# Patient Record
Sex: Male | Born: 1950 | Race: Black or African American | Hispanic: No | Marital: Married | Smoking: Former smoker
Health system: Southern US, Community
[De-identification: ages and names within clinical notes are randomized; demographics above are authoritative.]

## PROBLEM LIST (undated history)

## (undated) DIAGNOSIS — G4730 Sleep apnea, unspecified: Secondary | ICD-10-CM

## (undated) DIAGNOSIS — Z8619 Personal history of other infectious and parasitic diseases: Secondary | ICD-10-CM

## (undated) DIAGNOSIS — M1990 Unspecified osteoarthritis, unspecified site: Secondary | ICD-10-CM

## (undated) DIAGNOSIS — J45909 Unspecified asthma, uncomplicated: Secondary | ICD-10-CM

## (undated) DIAGNOSIS — I10 Essential (primary) hypertension: Secondary | ICD-10-CM

## (undated) HISTORY — DX: Essential (primary) hypertension: I10

## (undated) HISTORY — DX: Unspecified asthma, uncomplicated: J45.909

## (undated) HISTORY — PX: COLONOSCOPY: SHX174

## (undated) HISTORY — DX: Personal history of other infectious and parasitic diseases: Z86.19

## (undated) HISTORY — PX: CORRECTION HAMMER TOE: SUR317

---

## 2012-03-21 ENCOUNTER — Other Ambulatory Visit: Payer: Self-pay | Admitting: Orthopedic Surgery

## 2012-03-21 DIAGNOSIS — M25512 Pain in left shoulder: Secondary | ICD-10-CM

## 2012-03-29 ENCOUNTER — Ambulatory Visit
Admission: RE | Admit: 2012-03-29 | Discharge: 2012-03-29 | Disposition: A | Discharge status: Home / Self Care | Payer: BC Managed Care – PPO | Source: Ambulatory Visit | Attending: Orthopedic Surgery | Admitting: Orthopedic Surgery

## 2012-03-29 DIAGNOSIS — M25512 Pain in left shoulder: Secondary | ICD-10-CM

## 2014-01-30 ENCOUNTER — Encounter: Payer: Self-pay | Admitting: Family Medicine

## 2014-01-30 ENCOUNTER — Ambulatory Visit (INDEPENDENT_AMBULATORY_CARE_PROVIDER_SITE_OTHER): Payer: BLUE CROSS/BLUE SHIELD | Admitting: Family Medicine

## 2014-01-30 VITALS — BP 148/96 | HR 60 | Temp 98.4°F | Resp 16 | Ht 76.0 in | Wt 218.8 lb

## 2014-01-30 DIAGNOSIS — I10 Essential (primary) hypertension: Secondary | ICD-10-CM

## 2014-01-30 DIAGNOSIS — R768 Other specified abnormal immunological findings in serum: Secondary | ICD-10-CM | POA: Insufficient documentation

## 2014-01-30 DIAGNOSIS — R894 Abnormal immunological findings in specimens from other organs, systems and tissues: Secondary | ICD-10-CM

## 2014-01-30 DIAGNOSIS — Z13220 Encounter for screening for lipoid disorders: Secondary | ICD-10-CM

## 2014-01-30 DIAGNOSIS — G4730 Sleep apnea, unspecified: Secondary | ICD-10-CM | POA: Insufficient documentation

## 2014-01-30 LAB — COMPLETE METABOLIC PANEL WITH GFR
ALT: 133 U/L — ABNORMAL HIGH (ref 0–53)
AST: 93 U/L — ABNORMAL HIGH (ref 0–37)
Albumin: 4.2 g/dL (ref 3.5–5.2)
Alkaline Phosphatase: 59 U/L (ref 39–117)
BUN: 10 mg/dL (ref 6–23)
CO2: 26 mEq/L (ref 19–32)
Calcium: 9.7 mg/dL (ref 8.4–10.5)
Chloride: 103 mEq/L (ref 96–112)
Creat: 0.8 mg/dL (ref 0.50–1.35)
GFR, Est African American: >89 mL/min
GFR, Est Non African American: >89 mL/min
Glucose, Bld: 85 mg/dL (ref 70–99)
Potassium: 3.9 mEq/L (ref 3.5–5.3)
Sodium: 136 mEq/L (ref 135–145)
Total Bilirubin: 0.7 mg/dL (ref 0.2–1.2)
Total Protein: 7.6 g/dL (ref 6.0–8.3)

## 2014-01-30 LAB — LIPID PANEL
Cholesterol: 166 mg/dL (ref 0–200)
HDL: 71 mg/dL (ref >39)
LDL Cholesterol: 83 mg/dL (ref 0–99)
Total CHOL/HDL Ratio: 2.3 Ratio
Triglycerides: 59 mg/dL (ref <150)
VLDL: 12 mg/dL (ref 0–40)

## 2014-01-30 LAB — TSH: TSH: 1.373 u[IU]/mL (ref 0.350–4.500)

## 2014-01-30 LAB — T4, FREE: Free T4: 1.26 ng/dL (ref 0.80–1.80)

## 2014-01-30 MED ORDER — BLOOD PRESSURE MONITOR/WRIST DEVI: 1.0000 | Freq: Every day | Status: DC

## 2014-01-30 MED ORDER — AMLODIPINE-VALSARTAN-HCTZ 5-160-12.5 MG PO TABS: 1.0000 | ORAL_TABLET | Freq: Every day | ORAL | Status: DC

## 2014-01-30 NOTE — Patient Instructions (Addendum)
DASH Eating Plan DASH stands for "Dietary Approaches to Stop Hypertension." The DASH eating plan is a healthy eating plan that has been shown to reduce high blood pressure (hypertension). Additional health benefits may include reducing the risk of type 2 diabetes mellitus, heart disease, and stroke. The DASH eating plan may also help with weight loss. WHAT DO I NEED TO KNOW ABOUT THE DASH EATING PLAN? For the DASH eating plan, you will follow these general guidelines:  Choose foods with a percent daily value for sodium of less than 5% (as listed on the food label).  Use salt-free seasonings or herbs instead of table salt or sea salt.  Check with your health care provider or pharmacist before using salt substitutes.  Eat lower-sodium products, often labeled as "lower sodium" or "no salt added."  Eat fresh foods.  Eat more vegetables, fruits, and low-fat dairy products.  Choose whole grains. Look for the word "whole" as the first word in the ingredient list.  Choose fish and skinless chicken or turkey more often than red meat. Limit fish, poultry, and meat to 6 oz (170 g) each day.  Limit sweets, desserts, sugars, and sugary drinks.  Choose heart-healthy fats.  Limit cheese to 1 oz (28 g) per day.  Eat more home-cooked food and less restaurant, buffet, and fast food.  Limit fried foods.  Cook foods using methods other than frying.  Limit canned vegetables. If you do use them, rinse them well to decrease the sodium.  When eating at a restaurant, ask that your food be prepared with less salt, or no salt if possible. WHAT FOODS CAN I EAT? Seek help from a dietitian for individual calorie needs. Grains Whole grain or whole wheat bread. Brown rice. Whole grain or whole wheat pasta. Quinoa, bulgur, and whole grain cereals. Low-sodium cereals. Corn or whole wheat flour tortillas. Whole grain cornbread. Whole grain crackers. Low-sodium crackers. Vegetables Fresh or frozen vegetables  (raw, steamed, roasted, or grilled). Low-sodium or reduced-sodium tomato and vegetable juices. Low-sodium or reduced-sodium tomato sauce and paste. Low-sodium or reduced-sodium canned vegetables.  Fruits All fresh, canned (in natural juice), or frozen fruits. Meat and Other Protein Products Ground beef (85% or leaner), grass-fed beef, or beef trimmed of fat. Skinless chicken or turkey. Ground chicken or turkey. Pork trimmed of fat. All fish and seafood. Eggs. Dried beans, peas, or lentils. Unsalted nuts and seeds. Unsalted canned beans. Dairy Low-fat dairy products, such as skim or 1% milk, 2% or reduced-fat cheeses, low-fat ricotta or cottage cheese, or plain low-fat yogurt. Low-sodium or reduced-sodium cheeses. Fats and Oils Tub margarines without trans fats. Light or reduced-fat mayonnaise and salad dressings (reduced sodium). Avocado. Safflower, olive, or canola oils. Natural peanut or almond butter. Other Unsalted popcorn and pretzels. The items listed above may not be a complete list of recommended foods or beverages. Contact your dietitian for more options. WHAT FOODS ARE NOT RECOMMENDED? Grains White bread. White pasta. White rice. Refined cornbread. Bagels and croissants. Crackers that contain trans fat. Vegetables Creamed or fried vegetables. Vegetables in a cheese sauce. Regular canned vegetables. Regular canned tomato sauce and paste. Regular tomato and vegetable juices. Fruits Dried fruits. Canned fruit in light or heavy syrup. Fruit juice. Meat and Other Protein Products Fatty cuts of meat. Ribs, chicken wings, bacon, sausage, bologna, salami, chitterlings, fatback, hot dogs, bratwurst, and packaged luncheon meats. Salted nuts and seeds. Canned beans with salt. Dairy Whole or 2% milk, cream, half-and-half, and cream cheese. Whole-fat or sweetened yogurt. Full-fat   cheeses or blue cheese. Nondairy creamers and whipped toppings. Processed cheese, cheese spreads, or cheese  curds. Condiments Onion and garlic salt, seasoned salt, table salt, and sea salt. Canned and packaged gravies. Worcestershire sauce. Tartar sauce. Barbecue sauce. Teriyaki sauce. Soy sauce, including reduced sodium. Steak sauce. Fish sauce. Oyster sauce. Cocktail sauce. Horseradish. Ketchup and mustard. Meat flavorings and tenderizers. Bouillon cubes. Hot sauce. Tabasco sauce. Marinades. Taco seasonings. Relishes. Fats and Oils Butter, stick margarine, lard, shortening, ghee, and bacon fat. Coconut, palm kernel, or palm oils. Regular salad dressings. Other Pickles and olives. Salted popcorn and pretzels. The items listed above may not be a complete list of foods and beverages to avoid. Contact your dietitian for more information. WHERE CAN I FIND MORE INFORMATION? National Heart, Lung, and Blood Institute: CableProm[employer].itwww.nhlbi.nih.gov/health/health-topics/topics/dash/ Document Released: 12/30/2010 Document Revised: 05/27/2013 Document Reviewed: 11/14/2012 [facility] Patient Information 2015 [employer], [place]. This information is not intended to replace advice given to you by your health care provider. Make sure you discuss any questions you have with your health care provider.   You will continue taking current blood pressure medication- 1/2 tablet daily. Check BP daily; try to check at the same time each day and be sure you are seated and relaxed for 3-5 minutes. Record your readings and bring them to next visit. Goal BP: < or = 150/90

## 2014-01-30 NOTE — Progress Notes (Signed)
   Subjective:    Patient ID: Jason Mullins, male    DOB: [birth year], 64 y.o.   MRN: [id number]  HPI This 64 y.o. Male [facility]; he is a retired Advertising account plannerinsurance agent. [place]. Previous primary care with Renaye RakersVeita Bland, MD for treatment of HTN. Pt is compliant w/ medication but takes only 1/2 tablet daily. He is not doing home BP readings. Practices healthy lifestyle; plans to rejoin [employer]. He enjoys golf.  Pt diagnoses with Hepatitis C in 1980s w/ undetectable viral load. He has never required treatment but has liver function monitored periodically.  IMM- Pt declines Flu vaccine; states other vaccine should be in records from previous PCP.  Pt has no significant medical history or surgical history (s/p hammer toe surgery).  No Known Allergies   Review of Systems  Constitutional: Negative.   HENT: Negative.   Eyes: Negative.   Respiratory: Negative.   Cardiovascular: Negative.   Gastrointestinal: Negative.   Endocrine: Negative.   Genitourinary: Negative.   Musculoskeletal: Negative.   Skin: Negative.   Allergic/Immunologic: Negative.   Neurological: Negative.   Hematological: Negative.   Psychiatric/Behavioral: Negative.        Objective:   Physical Exam  Constitutional: He is oriented to person, place, and time. He appears well-developed and well-nourished. No distress.  HENT:  Head: Normocephalic and atraumatic.  Right Ear: External ear normal.  Left Ear: External ear normal.  Nose: Nose normal.  Mouth/Throat: Oropharynx is clear and moist.  Eyes: Conjunctivae and EOM are normal. Pupils are equal, round, and reactive to light. No scleral icterus.  Neck: Normal range of motion. Neck supple. No thyromegaly present.  Cardiovascular: Normal rate, regular rhythm and normal heart sounds.  Exam reveals no gallop.   No murmur heard. Pulmonary/Chest:  Effort normal and breath sounds normal. No respiratory distress. He has no wheezes.  Abdominal: Bowel sounds are normal. He exhibits no distension and no mass. There is no tenderness. There is no guarding.  Musculoskeletal: Normal range of motion. He exhibits no edema or tenderness.  Lymphadenopathy:    He has no cervical adenopathy.  Neurological: He is alert and oriented to person, place, and time. He has normal reflexes. No cranial nerve deficit. He exhibits normal muscle tone. Coordination normal.  Skin: Skin is warm and dry. No rash noted. He is not diaphoretic. No erythema.  Psychiatric: He has a normal mood and affect. His behavior is normal. Judgment and thought content normal.  Nursing note and vitals reviewed.      Assessment & Plan:  Benign essential HTN - Cont.  Amlodipine- Valsartan-HCTZ 160-12.5 mg  1/2 tablet daily. Monitor BP daily.  Plan: COMPLETE METABOLIC PANEL WITH GFR, T4, free, TSH  Hepatitis C antibody test positive- Monitor LFTs.  Screening for hyperlipidemia - Plan: Lipid panel

## 2014-02-04 NOTE — Progress Notes (Signed)
Quick Note:  Please advise pt regarding following labs... All labs are normal except liver function tests. AST and ALT are above normal; this is related to Hepatitis C infection. I do not have any other values to compare with so it would be a good idea to recheck these values in 3-4 months to check for stability or change. Labs will be repeated at your visit in April 2016.  Copy to pt. ______

## 2014-05-01 ENCOUNTER — Ambulatory Visit (INDEPENDENT_AMBULATORY_CARE_PROVIDER_SITE_OTHER): Payer: BLUE CROSS/BLUE SHIELD | Admitting: Family Medicine

## 2014-05-01 ENCOUNTER — Encounter: Payer: Self-pay | Admitting: Family Medicine

## 2014-05-01 VITALS — BP 128/73 | HR 60 | Temp 97.7°F | Resp 16 | Ht 75.5 in | Wt 216.2 lb

## 2014-05-01 DIAGNOSIS — R7989 Other specified abnormal findings of blood chemistry: Secondary | ICD-10-CM | POA: Diagnosis not present

## 2014-05-01 DIAGNOSIS — R768 Other specified abnormal immunological findings in serum: Secondary | ICD-10-CM

## 2014-05-01 DIAGNOSIS — R894 Abnormal immunological findings in specimens from other organs, systems and tissues: Secondary | ICD-10-CM | POA: Diagnosis not present

## 2014-05-01 DIAGNOSIS — I10 Essential (primary) hypertension: Secondary | ICD-10-CM

## 2014-05-01 DIAGNOSIS — R945 Abnormal results of liver function studies: Secondary | ICD-10-CM

## 2014-05-01 LAB — HEPATIC FUNCTION PANEL
ALT: 125 U/L — ABNORMAL HIGH (ref 0–53)
AST: 95 U/L — ABNORMAL HIGH (ref 0–37)
Albumin: 4.1 g/dL (ref 3.5–5.2)
Alkaline Phosphatase: 53 U/L (ref 39–117)
Bilirubin, Direct: 0.3 mg/dL (ref 0.0–0.3)
Indirect Bilirubin: 0.7 mg/dL (ref 0.2–1.2)
Total Bilirubin: 1 mg/dL (ref 0.2–1.2)
Total Protein: 7.6 g/dL (ref 6.0–8.3)

## 2014-05-01 NOTE — Patient Instructions (Signed)
DENTIST- Dr. Katherine RoanP. Sharon Walker  Phone number: [phone].   Hepatitis C Hepatitis C is a viral infection of the liver. Infection may go undetected for months or years because symptoms may be absent or very mild. Chronic liver disease is the main danger of hepatitis C. This may lead to scarring of the liver (cirrhosis), liver failure, and liver cancer. CAUSES  Hepatitis C is caused by the hepatitis C virus (HCV). Formerly, hepatitis C infections were most commonly transmitted through blood transfusions. In the early 1990s, routine testing of donated blood for hepatitis C and exclusion of blood that tests positive for HCV began. Now, HCV is most commonly transmitted from person to person through injection drug use, sharing needles, or sex with an infected person. A caregiver may also get the infection from exposure to the blood of an infected patient by way of a cut or needle stick.  SYMPTOMS  Acute Phase Many cases of acute HCV infection are mild and cause few problems.Some people may not even realize they are sick.Symptoms in others may last a few weeks to several months and include:  Feeling very tired.  Loss of appetite.  Nausea.  Vomiting.  Abdominal pain.  Dark yellow urine.  Yellow skin and eyes (jaundice).  Itching of the skin. Chronic Phase  Between 50% to 85% of people who get HCV infection become "chronic carriers." They often have no symptoms, but the virus stays in their body.They may spread the virus to others and can get long-term liver disease.  Many people with chronic HCV infection remain healthy for many years. However, up to 1 in 5 chronically infected people may develop severe liver diseases including scarring of the liver (cirrhosis), liver failure, or liver cancer. DIAGNOSIS  Diagnosis of hepatitis C infection is made by testing blood for the presence of hepatitis C viral particles called RNA. Other tests may also be done to measure the status of current liver  function, exclude other liver problems, or assess liver damage. TREATMENT  Treatment with many antiviral drugs is available and recommended for some patients with chronic HCV infection. Drug treatment is generally considered appropriate for patients who:  Are 65 years of age or older.  Have a positive test for HCV particles in the blood.  Have a liver tissue sample (biopsy) that shows chronic hepatitis and significant scarring (fibrosis).  Do not have signs of liver failure.  Have acceptable blood test results that confirm the wellness of other body organs.  Are willing to be treated and conform to treatment requirements.  Have no other circumstances that would prevent treatment from being recommended (contraindications). All people who are offered and choose to receive drug treatment must understand that careful medical follow up for many months and even years is crucial in order to make successful care possible. The goal of drug treatment is to eliminate any evidence of HCV in the blood on a long-term basis. This is called a "sustained virologic response" or SVR. Achieving a SVR is associated with a decrease in the chance of life-threatening liver problems, need for a liver transplant, liver cancer rates, and liver-related complications. Successful treatment currently requires taking treatment drugs for at least 24 weeks and up to 72 weeks. An injected drug (interferon) given weekly and an oral antiviral medicine taken daily are usually prescribed. Side effects from these drugs are common and some may be very serious. Your response to treatment must be carefully monitored by both you and your caregiver throughout the entire treatment  period. PREVENTION There is no vaccine for hepatitis C. The only way to prevent the disease is to reduce the risk of exposure to the virus.   Avoid sharing drug needles or personal items like toothbrushes, razors, and nail clippers with an infected  person.  Healthcare workers need to avoid injuries and wear appropriate protective equipment such as gloves, gowns, and face masks when performing invasive medical or nursing procedures. HOME CARE INSTRUCTIONS  To avoid making your liver disease worse:  Strictly avoid drinking alcohol.  Carefully review all new prescriptions of medicines with your caregiver. Ask your caregiver which drugs you should avoid. The following drugs are toxic to the liver, and your caregiver may tell you to avoid them:  Isoniazid.  Methyldopa.  Acetaminophen.  Anabolic steroids (muscle-building drugs).  Erythromycin.  Oral contraceptives (birth control pills).  Check with your caregiver to make sure medicine you are currently taking will not be harmful.  Periodic blood tests may be required. Follow your caregiver's advice about when you should have blood tests.  Avoid a sexual relationship until advised otherwise by your caregiver.  Avoid activities that could expose other people to your blood. Examples include sharing a toothbrush, nail clippers, razors, and needles.  Bed rest is not necessary, but it may make you feel better. Recovery time is not related to the amount of rest you receive.  This infection is contagious. Follow your caregiver's instructions in order to avoid spread of the infection. SEEK IMMEDIATE MEDICAL CARE IF:  You have increasing fatigue or weakness.  You have an oral temperature above 102 F (38.9 C), not controlled by medicine.  You develop loss of appetite, nausea, or vomiting.  You develop jaundice.  You develop easy bruising or bleeding.  You develop any severe problems as a result of your treatment. MAKE SURE YOU:   Understand these instructions.  Will watch your condition.  Will get help right away if you are not doing well or get worse. Document Released: 01/08/2000 Document Revised: 04/04/2011 Document Reviewed: 04/24/2013 [facility] Patient Information  2015 [place], [place]. This information is not intended to replace advice given to you by your health care provider. Make sure you discuss any questions you have with your health care provider.

## 2014-05-02 LAB — HEPATITIS C ANTIBODY: HCV Ab: REACTIVE — AB

## 2014-05-05 LAB — HEPATITIS C RNA QUANTITATIVE
HCV Quantitative Log: 7.08 {Log} — ABNORMAL HIGH (ref <1.18)
HCV Quantitative: 11945904 IU/mL — ABNORMAL HIGH (ref <15)

## 2014-05-05 NOTE — Progress Notes (Signed)
Subjective:    Patient ID: Jason Mullins, male    DOB: Oct 25, 1950, 89 y.o.   MRN: [id number]  HPI  This 64 y.o. Male returns to discuss LFT elevation on last CMET, drawn in Jan 2016. Pt known to be Hepatitis C antibody positive. This was diagnosed 30 years ago w/ no detectable virus at that time. His LFTs have been chronically above normal. Pt has been asymptomatic until recently when he began to experience mild itching. No other significant symptoms.  Pt has well controlled HTN; compliance with medication is good. At last visit, he reported mild lightheadedness when dehydrated after a round of golf. Hydration is better, w/o recurrence of dizziness or lightheadedness. He denies CP or palpitations, SOB, edema, weakness or syncope.  Patient Active Problem List   Diagnosis Date Noted  . Benign essential HTN 01/30/2014  . Hepatitis C antibody test positive 01/30/2014  . History of Sleep apnea 01/30/2014    Prior to Admission medications   Medication Sig Start Date End Date Taking? Authorizing Provider  Amlodipine-Valsartan-HCTZ 5-160-12.5 MG TABS Take 1 tablet by mouth daily. 01/30/14  Yes Maurice MarchBarbara B Marlow Hendrie, MD  Blood Pressure Monitoring (BLOOD PRESSURE MONITOR/WRIST) DEVI 1 Device by Does not apply route daily. 01/30/14  Yes Maurice MarchBarbara B Aven Christen, MD    Past Surgical History  Procedure Laterality Date  . Correction hammer toe      History   Social History  . Marital Status: Married    Spouse Name: [name]  . Number of Children: N/A  . Years of Education: N/A   Occupational History  . Retired.   Social History Main Topics  . Smoking status: Former Games developermoker  . Smokeless tobacco: Not on file  . Alcohol Use: 0.0 oz/week    0 Standard drinks or equivalent per week     Comment: 3-4 times a week.  . Drug Use: No  . Sexual Activity: Yes   Other Topics Concern  . Not on file   Social History Narrative    Family History  Problem Relation Age of Onset  . Cancer Mother   . Cancer  Father     Review of Systems  Constitutional: Negative.   HENT: Negative.   Eyes: Negative.   Respiratory: Negative.   Cardiovascular: Negative.   Gastrointestinal: Negative.   Endocrine: Negative.   Musculoskeletal: Negative.   Allergic/Immunologic: Positive for environmental allergies.  Neurological: Negative.   Psychiatric/Behavioral: Negative.       Objective:   Physical Exam  Constitutional: He is oriented to person, place, and time. He appears well-developed and well-nourished. No distress.  HENT:  Head: Normocephalic and atraumatic.  Right Ear: External ear normal.  Left Ear: External ear normal.  Mouth/Throat: Oropharynx is clear and moist.  Eyes: Conjunctivae and EOM are normal. No scleral icterus.  Neck: Normal range of motion. Neck supple.  Cardiovascular: Normal rate and regular rhythm.   Pulmonary/Chest: Effort normal. No respiratory distress.  Musculoskeletal: Normal range of motion. He exhibits no edema.  Neurological: He is alert and oriented to person, place, and time. No cranial nerve deficit. Coordination normal.  Skin: Skin is warm and dry. No rash noted. He is not diaphoretic. No erythema.  Psychiatric: He has a normal mood and affect. His behavior is normal. Judgment and thought content normal.  Nursing note and vitals reviewed.      Assessment & Plan:  Hepatitis C antibody test positive - Known to be Ab+ but need to know if there is detectable virus, previously  not present. Plan: Hepatitis C antibody, reflex  Elevated LFTs - Plan: Hepatic function panel  Benign essential HTN- Stable and well controlled on current medication.

## 2014-05-06 ENCOUNTER — Telehealth: Payer: Self-pay | Admitting: Family Medicine

## 2014-05-06 DIAGNOSIS — R768 Other specified abnormal immunological findings in serum: Secondary | ICD-10-CM

## 2014-05-06 NOTE — Telephone Encounter (Signed)
Spoke with pt about results of Hep c labs; he has very high viral load. I will refer him to Hep C specialist for further testing (genotype, liver biopsy, etc) He has never had treatment.

## 2014-06-25 ENCOUNTER — Other Ambulatory Visit (HOSPITAL_COMMUNITY): Payer: Self-pay | Admitting: Nurse Practitioner

## 2014-06-25 DIAGNOSIS — B182 Chronic viral hepatitis C: Secondary | ICD-10-CM

## 2014-07-15 ENCOUNTER — Telehealth (HOSPITAL_COMMUNITY): Payer: Self-pay

## 2014-07-15 NOTE — Telephone Encounter (Signed)
Called to remind pt of 11am appt at [facility]. Pt agreed to stay npo 6 hrs prior in prep for exam. AW

## 2014-07-16 ENCOUNTER — Ambulatory Visit (HOSPITAL_COMMUNITY)
Admission: RE | Admit: 2014-07-16 | Discharge: 2014-07-16 | Disposition: A | Discharge status: Home / Self Care | Payer: BLUE CROSS/BLUE SHIELD | Source: Ambulatory Visit | Attending: Nurse Practitioner | Admitting: Nurse Practitioner

## 2014-07-16 DIAGNOSIS — B182 Chronic viral hepatitis C: Secondary | ICD-10-CM | POA: Diagnosis present

## 2014-07-16 DIAGNOSIS — N281 Cyst of kidney, acquired: Secondary | ICD-10-CM | POA: Insufficient documentation

## 2014-09-08 DIAGNOSIS — B182 Chronic viral hepatitis C: Secondary | ICD-10-CM | POA: Diagnosis not present

## 2014-10-06 ENCOUNTER — Telehealth: Payer: Self-pay

## 2014-10-06 NOTE — Telephone Encounter (Signed)
Pt called back, he had called earlier this morning, he wanted to make sure his refill request would be reviewed today, because he is completely out of his BP meds Please call pt to advise

## 2014-10-06 NOTE — Telephone Encounter (Signed)
Patient is calling to get refill on blood pressure rx He uses [facility] on [address] in [place]

## 2014-10-07 ENCOUNTER — Other Ambulatory Visit: Payer: Self-pay | Admitting: *Deleted

## 2014-10-07 DIAGNOSIS — I10 Essential (primary) hypertension: Secondary | ICD-10-CM

## 2014-10-07 MED ORDER — AMLODIPINE-VALSARTAN-HCTZ 5-160-12.5 MG PO TABS: 1.0000 | ORAL_TABLET | Freq: Every day | ORAL | Status: DC

## 2014-11-05 ENCOUNTER — Encounter: Payer: Self-pay | Admitting: Family Medicine

## 2014-11-05 ENCOUNTER — Ambulatory Visit (INDEPENDENT_AMBULATORY_CARE_PROVIDER_SITE_OTHER): Payer: Medicare Other | Admitting: Family Medicine

## 2014-11-05 VITALS — BP 142/90 | HR 63 | Temp 98.4°F | Resp 16 | Ht 75.5 in | Wt 213.0 lb

## 2014-11-05 DIAGNOSIS — Z23 Encounter for immunization: Secondary | ICD-10-CM

## 2014-11-05 DIAGNOSIS — I10 Essential (primary) hypertension: Secondary | ICD-10-CM | POA: Diagnosis not present

## 2014-11-05 DIAGNOSIS — B1710 Acute hepatitis C without hepatic coma: Secondary | ICD-10-CM | POA: Diagnosis not present

## 2014-11-05 DIAGNOSIS — B182 Chronic viral hepatitis C: Secondary | ICD-10-CM | POA: Diagnosis not present

## 2014-11-05 MED ORDER — AMLODIPINE BESYLATE 5 MG PO TABS: 5.0000 mg | ORAL_TABLET | Freq: Every day | ORAL | Status: DC

## 2014-11-05 MED ORDER — LOSARTAN POTASSIUM-HCTZ 100-12.5 MG PO TABS
1.0000 | ORAL_TABLET | Freq: Every day | ORAL | Status: DC
Start: 2014-11-05 — End: 2015-05-06

## 2014-11-05 NOTE — Patient Instructions (Addendum)
You received your first Hep B and Hep A vaccines today- please come back for the rest of the series as directed I have changed your BP medication to the regimen below- it will be 2 pills but should be covered by your insurance.  Please continue to keep an eye on your blood pressure and let me know if you are running too low or too high.  I would like you to be 120- 140/ 75- 90  Meds ordered this encounter  Medications  . amLODipine (NORVASC) 5 MG tablet    Sig: Take 1 tablet (5 mg total) by mouth daily.    Dispense:  90 tablet    Refill:  3  . losartan-hydrochlorothiazide (HYZAAR) 100-12.5 MG tablet    Sig: Take 1 tablet by mouth daily.    Dispense:  90 tablet    Refill:  3

## 2014-11-05 NOTE — Progress Notes (Signed)
[facility] [address], [place] [address] [phone]  Date:  11/05/2014   Name:  Jason CockingJames Mullins   DOB:  [birth year]   MRN:  [id number]  PCP:  No primary care provider on file.    Chief Complaint: Follow-up; Hypertension; and Immunizations   History of Present Illness:  Jason CockingJames Pal is a 64 y.o. very pleasant male patient who presents with the following:  I am taking over as PCP for this pt since my partner Dr. Audria NineMcPherson has retired He has been on a amlodipine/ valsartan/ hctz pill but this is no longer covered   He is just finishing up harvoni for Hep C- it looks like he has cleared the infection He is supposed to get Hep A and B shots- he has never had these and would like to do so  Patient Active Problem List   Diagnosis Date Noted  . Benign essential HTN 01/30/2014  . Hepatitis C antibody test positive 01/30/2014  . History of Sleep apnea 01/30/2014    Past Medical History  Diagnosis Date  . Asthma   . Hypertension     Past Surgical History  Procedure Laterality Date  . Correction hammer toe      Social History  Substance Use Topics  . Smoking status: Former Games developermoker  . Smokeless tobacco: None  . Alcohol Use: 0.0 oz/week    0 Standard drinks or equivalent per week     Comment: 3-4 times a week.    Family History  Problem Relation Age of Onset  . Cancer Mother   . Cancer Father     No Known Allergies  Medication list has been reviewed and updated.  Current Outpatient Prescriptions on File Prior to Visit  Medication Sig Dispense Refill  . Amlodipine-Valsartan-HCTZ 5-160-12.5 MG TABS Take 1 tablet by mouth daily. 90 tablet 1  . Blood Pressure Monitoring (BLOOD PRESSURE MONITOR/WRIST) DEVI 1 Device by Does not apply route daily. 1 Device 0   No current facility-administered medications on file prior to visit.    Review of Systems:  As per HPI- otherwise negative.   Physical Examination: Filed Vitals:   11/05/14 1612   BP: 150/100  Pulse: 63  Temp: 98.4 F (36.9 C)  Resp: 16   Filed Vitals:   11/05/14 1612  Height: 6' 3.5" (1.918 m)  Weight: 213 lb (96.616 kg)   Body mass index is 26.26 kg/(m^2). Ideal Body Weight: Weight in (lb) to have BMI = 25: 202.3  GEN: WDWN, NAD, Non-toxic, A & O x 3, looks well HEENT: Atraumatic, Normocephalic. Neck supple. No masses, No LAD. Ears and Nose: No external deformity. CV: RRR, No M/G/R. No JVD. No thrill. No extra heart sounds. PULM: CTA B, no wheezes, crackles, rhonchi. No retractions. No resp. distress. No accessory muscle use. ABD: S, NT, ND, +BS. No rebound. No HSM. EXTR: No c/c/e NEURO Normal gait.  PSYCH: Normally interactive. Conversant. Not depressed or anxious appearing.  Calm demeanor.    Assessment and Plan: Essential hypertension - Plan: amLODipine (NORVASC) 5 MG tablet, losartan-hydrochlorothiazide (HYZAAR) 100-12.5 MG tablet  Acute hepatitis C virus infection without hepatic coma - Plan: Hepatitis A vaccine adult IM, Hepatitis B vaccine adult IM  Need for hepatitis vaccination - Plan: Hepatitis A vaccine adult IM, Hepatitis B vaccine adult IM history of chronic hep C, hopefully will soon be cured with harvoni He was asked to get hep b and hep a vaccinations- we are glad to start this for  him today Changed his BP medication to a regimen that is covered as below  Meds ordered this encounter  Medications  . amLODipine (NORVASC) 5 MG tablet    Sig: Take 1 tablet (5 mg total) by mouth daily.    Dispense:  90 tablet    Refill:  3  . losartan-hydrochlorothiazide (HYZAAR) 100-12.5 MG tablet    Sig: Take 1 tablet by mouth daily.    Dispense:  90 tablet    Refill:  3      Signed Abbe Amsterdam, MD

## 2014-11-12 DIAGNOSIS — K7469 Other cirrhosis of liver: Secondary | ICD-10-CM | POA: Diagnosis not present

## 2014-11-12 DIAGNOSIS — B182 Chronic viral hepatitis C: Secondary | ICD-10-CM | POA: Diagnosis not present

## 2014-11-13 ENCOUNTER — Other Ambulatory Visit: Payer: Self-pay | Admitting: Nurse Practitioner

## 2014-11-13 DIAGNOSIS — K7469 Other cirrhosis of liver: Secondary | ICD-10-CM

## 2014-12-29 ENCOUNTER — Ambulatory Visit
Admission: RE | Admit: 2014-12-29 | Discharge: 2014-12-29 | Disposition: A | Discharge status: Home / Self Care | Payer: Medicare Other | Source: Ambulatory Visit | Attending: Nurse Practitioner | Admitting: Nurse Practitioner

## 2014-12-29 DIAGNOSIS — K7460 Unspecified cirrhosis of liver: Secondary | ICD-10-CM | POA: Diagnosis not present

## 2014-12-29 DIAGNOSIS — K7469 Other cirrhosis of liver: Secondary | ICD-10-CM

## 2015-02-09 DIAGNOSIS — B182 Chronic viral hepatitis C: Secondary | ICD-10-CM | POA: Diagnosis not present

## 2015-02-17 DIAGNOSIS — H40013 Open angle with borderline findings, low risk, bilateral: Secondary | ICD-10-CM | POA: Diagnosis not present

## 2015-02-17 DIAGNOSIS — H527 Unspecified disorder of refraction: Secondary | ICD-10-CM | POA: Diagnosis not present

## 2015-04-29 ENCOUNTER — Other Ambulatory Visit: Payer: Self-pay | Admitting: Nurse Practitioner

## 2015-04-29 DIAGNOSIS — K7460 Unspecified cirrhosis of liver: Secondary | ICD-10-CM

## 2015-04-29 DIAGNOSIS — K7469 Other cirrhosis of liver: Secondary | ICD-10-CM | POA: Diagnosis not present

## 2015-04-29 DIAGNOSIS — B182 Chronic viral hepatitis C: Secondary | ICD-10-CM | POA: Diagnosis not present

## 2015-05-05 ENCOUNTER — Telehealth: Payer: Self-pay

## 2015-05-05 NOTE — Telephone Encounter (Signed)
Left message for patient to return call regarding Pre visit information. 

## 2015-05-06 ENCOUNTER — Encounter: Payer: Self-pay | Admitting: Family Medicine

## 2015-05-06 ENCOUNTER — Ambulatory Visit (INDEPENDENT_AMBULATORY_CARE_PROVIDER_SITE_OTHER): Payer: Medicare Other | Admitting: Family Medicine

## 2015-05-06 VITALS — BP 120/80 | HR 51 | Temp 98.0°F | Resp 16 | Ht 75.5 in | Wt 222.2 lb

## 2015-05-06 DIAGNOSIS — Z23 Encounter for immunization: Secondary | ICD-10-CM | POA: Diagnosis not present

## 2015-05-06 DIAGNOSIS — Z0189 Encounter for other specified special examinations: Secondary | ICD-10-CM

## 2015-05-06 DIAGNOSIS — I10 Essential (primary) hypertension: Secondary | ICD-10-CM | POA: Diagnosis not present

## 2015-05-06 DIAGNOSIS — Z5181 Encounter for therapeutic drug level monitoring: Secondary | ICD-10-CM

## 2015-05-06 DIAGNOSIS — Z125 Encounter for screening for malignant neoplasm of prostate: Secondary | ICD-10-CM | POA: Diagnosis not present

## 2015-05-06 DIAGNOSIS — Z0000 Encounter for general adult medical examination without abnormal findings: Secondary | ICD-10-CM | POA: Diagnosis not present

## 2015-05-06 DIAGNOSIS — Z13220 Encounter for screening for lipoid disorders: Secondary | ICD-10-CM | POA: Diagnosis not present

## 2015-05-06 DIAGNOSIS — R001 Bradycardia, unspecified: Secondary | ICD-10-CM | POA: Diagnosis not present

## 2015-05-06 DIAGNOSIS — Z130 Encounter for screening for diseases of the blood and blood-forming organs and certain disorders involving the immune mechanism: Secondary | ICD-10-CM

## 2015-05-06 LAB — LIPID PANEL
Cholesterol: 180 mg/dL (ref 0–200)
HDL: 56.7 mg/dL (ref >39.00)
LDL Cholesterol: 114 mg/dL — ABNORMAL HIGH (ref 0–99)
NonHDL: 123.22
Total CHOL/HDL Ratio: 3
Triglycerides: 48 mg/dL (ref 0.0–149.0)
VLDL: 9.6 mg/dL (ref 0.0–40.0)

## 2015-05-06 LAB — COMPREHENSIVE METABOLIC PANEL
ALT: 12 U/L (ref 0–53)
AST: 18 U/L (ref 0–37)
Albumin: 4.2 g/dL (ref 3.5–5.2)
Alkaline Phosphatase: 63 U/L (ref 39–117)
BUN: 22 mg/dL (ref 6–23)
CO2: 31 mEq/L (ref 19–32)
Calcium: 10.3 mg/dL (ref 8.4–10.5)
Chloride: 103 mEq/L (ref 96–112)
Creatinine, Ser: 1.06 mg/dL (ref 0.40–1.50)
GFR: 89.97 mL/min (ref >60.00)
Glucose, Bld: 86 mg/dL (ref 70–99)
Potassium: 4.2 mEq/L (ref 3.5–5.1)
Sodium: 138 mEq/L (ref 135–145)
Total Bilirubin: 0.7 mg/dL (ref 0.2–1.2)
Total Protein: 7.5 g/dL (ref 6.0–8.3)

## 2015-05-06 LAB — CBC
HCT: 43 % (ref 39.0–52.0)
Hemoglobin: 14.5 g/dL (ref 13.0–17.0)
MCHC: 33.9 g/dL (ref 30.0–36.0)
MCV: 89.2 fl (ref 78.0–100.0)
Platelets: 217 10*3/uL (ref 150.0–400.0)
RBC: 4.81 Mil/uL (ref 4.22–5.81)
RDW: 14.2 % (ref 11.5–15.5)
WBC: 4.3 10*3/uL (ref 4.0–10.5)

## 2015-05-06 LAB — PSA, MEDICARE: PSA: 0.63 ng/ml (ref 0.10–4.00)

## 2015-05-06 MED ORDER — LOSARTAN POTASSIUM-HCTZ 100-12.5 MG PO TABS: 1.0000 | ORAL_TABLET | Freq: Every day | ORAL | Status: DC

## 2015-05-06 MED ORDER — AMLODIPINE BESYLATE 5 MG PO TABS: 5.0000 mg | ORAL_TABLET | Freq: Every day | ORAL | Status: DC

## 2015-05-06 NOTE — Patient Instructions (Signed)
It was great to see you today Continue to follow a healthy diet, exercise and take your BP medications You got your first (of 2) pneumonia vaccine today- 2nd can be done next year Colonoscopy appears to be up to date

## 2015-05-06 NOTE — Progress Notes (Signed)
Pre visit review using our clinic review tool, if applicable. No additional management support is needed unless otherwise documented below in the visit note. 

## 2015-05-06 NOTE — Progress Notes (Signed)
Jason Mullins is a 65 y.o. male who presents for a welcome to Medicare exam.   Cardiac risk factors: advanced age (older than 49 for men, 16 for women), hypertension and male gender.  Depression Screen (Note: if answer to either of the following is "Yes", a more complete depression screening is indicated)  Q1: Over the past two weeks, have you felt down, depressed or hopeless? No  Q2: Over the past two weeks, have you felt little interest or pleasure in doing things? no  Activities of Daily Living In your present state of health, do you have any difficulty performing the following activities?:  Preparing food and eating?: No Bathing yourself: No Getting dressed: No Using the toilet:No Moving around from place to place: No In the past year have you fallen or had a near fall?:No  Current exercise habits: Home exercise routine includes treadmill. Gym/ health club routine includes light weights.  Dietary issues discussed: yes, healthy diet, low fat, watches his carbs  Hearing difficulties: No Safe in current home environment: yes  The following portions of the patient's history were reviewed and updated as appropriate: allergies, current medications, past family history, past medical history, past social history, past surgical history and problem list. Review of Systems A comprehensive review of systems was negative.  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 He did harvoni for 12 weeks He did have the hep A and B vaccines No dysuria, no issues with urination, no CP or SOB, no lightheadedness He is quite active with going to the gym and golfing.     Objective:     Vision by Snellen chart: right eye:20/20, left eye:20/20 Blood pressure 120/80, pulse 51, temperature 98 F (36.7 C), temperature source Oral, resp. rate 16, height 6' 3.5" (1.918 m), weight 222 lb 3.2 oz (100.789 kg), SpO2 98 %. Body mass index is 27.4 kg/(m^2).   GEN: WDWN, NAD, Non-toxic, A & O x 3 HEENT:  Atraumatic, Normocephalic. Neck supple. No masses, No LAD. Ears and Nose: No external deformity. CV: RRR, No M/G/R. No JVD. No thrill. No extra heart sounds. PULM: CTA B, no wheezes, crackles, rhonchi. No retractions. No resp. distress. No accessory muscle use. ABD: S, NT, ND, +BS. No rebound. No HSM. EXTR: No c/c/e NEURO Normal gait.  PSYCH: Normally interactive. Conversant. Not depressed or anxious appearing.  Calm demeanor.   Assessment:    Encounter for wellness examination - Plan: CBC, Lipid panel, Pneumococcal conjugate vaccine 13-valent IM, EKG 12-Lead  Essential hypertension - Plan: losartan-hydrochlorothiazide (HYZAAR) 100-12.5 MG tablet, amLODipine (NORVASC) 5 MG tablet, Comprehensive metabolic panel, Lipid panel  Screening for hyperlipidemia - Plan: Lipid panel  Screening for prostate cancer - Plan: PSA, Medicare  Screening for deficiency anemia - Plan: CBC  Immunization due - Plan: Pneumococcal conjugate vaccine 13-valent IM  Medication monitoring encounter - Plan: CBC  Bradycardia  BP is well controlled with current medication Screening labs as above   EKG: sinus bradycardia with HR of 43 BPM- will send pt a note about this along with his labs, watch for any symptoms of bradycardia.  However at present he reports feeling very well overall  Pulse Readings from Last 3 Encounters:  05/06/15 51  11/05/14 63  05/01/14 60   Bradycardia likely due to athletic heart Plan:     During the course of the visit the patient was educated and counseled about appropriate screening and preventive services including:   Pneumococcal vaccine   Screening electrocardiogram  Prostate cancer screening  Colorectal cancer screening  Diabetes screening  Patient Instructions (the written plan) was given to the patient.

## 2015-05-13 ENCOUNTER — Encounter: Payer: Self-pay | Admitting: Family Medicine

## 2015-05-13 ENCOUNTER — Ambulatory Visit (INDEPENDENT_AMBULATORY_CARE_PROVIDER_SITE_OTHER): Payer: Medicare Other | Admitting: Family Medicine

## 2015-05-13 VITALS — BP 148/98 | HR 68 | Temp 99.2°F | Ht 76.0 in | Wt 213.4 lb

## 2015-05-13 DIAGNOSIS — R1013 Epigastric pain: Secondary | ICD-10-CM | POA: Diagnosis not present

## 2015-05-13 DIAGNOSIS — R509 Fever, unspecified: Secondary | ICD-10-CM | POA: Diagnosis not present

## 2015-05-13 LAB — POCT INFLUENZA A/B
Influenza A, POC: NEGATIVE
Influenza B, POC: NEGATIVE

## 2015-05-13 MED ORDER — ONDANSETRON HCL 4 MG PO TABS: 4.0000 mg | ORAL_TABLET | Freq: Three times a day (TID) | ORAL | Status: DC | PRN

## 2015-05-13 NOTE — Progress Notes (Signed)
Pre visit review using our clinic tool,if applicable. No additional management support is needed unless otherwise documented below in the visit note.  

## 2015-05-13 NOTE — Patient Instructions (Signed)
I am sorry that you are sick!   We will check a blood count today to help us determine if this is a vaccine reaction or if you are just sick Try the zofran as needed for nausea- even if you do not feel nauseated I think this may help you to eat Do try to sip on some ginger ale, gatorade, and eat some bland food like saltines and soup

## 2015-05-13 NOTE — Progress Notes (Addendum)
[facility] [address], [address] [place], [place] [address] [phone]  Date:  05/13/2015   Name:  Jason Mullins   DOB:  Feb 20, 1950   MRN:  [id number]  PCP:  Abbe Amsterdam, MD    Chief Complaint: No chief complaint on file.   History of Present Illness:  Jason Mullins is a 65 y.o. very pleasant male patient who presents with the following:  He was seen a week ago for a wellness exam and had a prevnar shot  Here today with concern of getting sick about 5 days ago.  He notes congestion, chills and low grade fever.  He does not have nausea per se but does not feel like eating or drinking.  Admits he has not been eating or drinking much and has lost weight.    He does have a cough, some body aches.   Mild abd pains that come and go He has been using cold preps OTC.   He very rarely gets ill.   We are unsure if this is a reaction the the shot or something else  Wt Readings from Last 3 Encounters:  05/13/15 213 lb 6.4 oz (96.798 kg)  05/06/15 222 lb 3.2 oz (100.789 kg)  11/05/14 213 lb (96.616 kg)     Patient Active Problem List   Diagnosis Date Noted  . Benign essential HTN 01/30/2014  . Hepatitis C antibody test positive 01/30/2014  . History of Sleep apnea 01/30/2014    Past Medical History  Diagnosis Date  . Asthma   . Hypertension     Past Surgical History  Procedure Laterality Date  . Correction hammer toe      Social History  Substance Use Topics  . Smoking status: Former Games developer  . Smokeless tobacco: Never Used     Comment: 1 cigar a month  . Alcohol Use: 0.0 oz/week    0 Standard drinks or equivalent per week     Comment: 1-2 times a week.    Family History  Problem Relation Age of Onset  . Cancer Mother   . Cancer Father     No Known Allergies  Medication list has been reviewed and updated.  Current Outpatient Prescriptions on File Prior to Visit  Medication Sig Dispense Refill  .  amLODipine (NORVASC) 5 MG tablet Take 1 tablet (5 mg total) by mouth daily. 90 tablet 3  . Blood Pressure Monitoring (BLOOD PRESSURE MONITOR/WRIST) DEVI 1 Device by Does not apply route daily. 1 Device 0  . losartan-hydrochlorothiazide (HYZAAR) 100-12.5 MG tablet Take 1 tablet by mouth daily. 90 tablet 3   No current facility-administered medications on file prior to visit.    Review of Systems:  As per HPI- otherwise negative.   Physical Examination: Filed Vitals:   05/13/15 1640  BP: 148/98  Pulse: 68  Temp: 99.2 F (37.3 C)   Filed Vitals:   05/13/15 1640  Height:  (1.93 m)  Weight: 213 lb 6.4 oz (96.798 kg)   Body mass index is 25.99 kg/(m^2). Ideal Body Weight: Weight in (lb) to have BMI = 25: 205  GEN: WDWN, NAD, Non-toxic, A & O x 3, appears well but not quite his normal self.  Coughing in room HEENT: Atraumatic, Normocephalic. Neck supple. No masses, No LAD.  Bilateral TM wnl, oropharynx normal.  PEERL,EOMI.   Ears and Nose: No external deformity. CV: RRR, No M/G/R. No JVD. No thrill. No extra heart sounds. PULM:  CTA B, no wheezes, crackles, rhonchi. No retractions. No resp. distress. No accessory muscle use. ABD: S, ND, +BS. No rebound. No HSM.  Mild epigastric TTP EXTR: No c/c/e NEURO Normal gait.  PSYCH: Normally interactive. Conversant. Not depressed or anxious appearing.  Calm demeanor.   Results for orders placed or performed in visit on 05/13/15  POCT Influenza A/B  Result Value Ref Range   Influenza A, POC Negative Negative   Influenza B, POC Negative Negative    Assessment and Plan: Chills with fever - Plan: POCT Influenza A/B, ondansetron (ZOFRAN) 4 MG tablet, CBC  Abdominal pain, epigastric - Plan: Lipase  Here today with sx of illness including cough, body aches, fatigue, low grade fever and lack of appetite for 5 days.  This would be a very unusual reaction to prevnar but this is possible.  Other possibility is an unrelated illness.   Will  check a CBC and lipase today, given rx for zofran.  Encouraged him to eat and drink even if he does not really feel hungry.  The zofran should help him feel more like eating.   I will be in touch with his labs and he will let me know if getting worse  Signed Abbe AmsterdamJessica Copland, MD  Received his labs 4/20 and called- he is doing somewhat better. Labs do not show a bacterial illness.  He will let me know if not continuing to improve.  He did eat today!   Results for orders placed or performed in visit on 05/13/15  CBC  Result Value Ref Range   WBC 6.8 4.0 - 10.5 K/uL   RBC 5.47 4.22 - 5.81 Mil/uL   Platelets 188.0 150.0 - 400.0 K/uL   Hemoglobin 16.4 13.0 - 17.0 g/dL   HCT 81.148.9 91.439.0 - 78.252.0 %   MCV 89.4 78.0 - 100.0 fl   MCHC 33.6 30.0 - 36.0 g/dL   RDW 95.613.6 21.311.5 - 08.615.5 %  Lipase  Result Value Ref Range   Lipase 17.0 11.0 - 59.0 U/L  POCT Influenza A/B  Result Value Ref Range   Influenza A, POC Negative Negative   Influenza B, POC Negative Negative

## 2015-05-14 LAB — CBC
HCT: 48.9 % (ref 39.0–52.0)
Hemoglobin: 16.4 g/dL (ref 13.0–17.0)
MCHC: 33.6 g/dL (ref 30.0–36.0)
MCV: 89.4 fl (ref 78.0–100.0)
Platelets: 188 10*3/uL (ref 150.0–400.0)
RBC: 5.47 Mil/uL (ref 4.22–5.81)
RDW: 13.6 % (ref 11.5–15.5)
WBC: 6.8 10*3/uL (ref 4.0–10.5)

## 2015-05-14 LAB — LIPASE: Lipase: 17 U/L (ref 11.0–59.0)

## 2015-08-17 DIAGNOSIS — R21 Rash and other nonspecific skin eruption: Secondary | ICD-10-CM | POA: Diagnosis not present

## 2015-08-17 DIAGNOSIS — B354 Tinea corporis: Secondary | ICD-10-CM | POA: Diagnosis not present

## 2015-08-17 DIAGNOSIS — B356 Tinea cruris: Secondary | ICD-10-CM | POA: Diagnosis not present

## 2015-09-03 ENCOUNTER — Ambulatory Visit
Admission: RE | Admit: 2015-09-03 | Discharge: 2015-09-03 | Disposition: A | Discharge status: Home / Self Care | Payer: Medicare Other | Source: Ambulatory Visit | Attending: Nurse Practitioner | Admitting: Nurse Practitioner

## 2015-09-03 DIAGNOSIS — K7460 Unspecified cirrhosis of liver: Secondary | ICD-10-CM | POA: Diagnosis not present

## 2015-09-10 DIAGNOSIS — R21 Rash and other nonspecific skin eruption: Secondary | ICD-10-CM | POA: Diagnosis not present

## 2015-12-29 ENCOUNTER — Other Ambulatory Visit: Payer: Self-pay | Admitting: Nurse Practitioner

## 2015-12-29 DIAGNOSIS — K7469 Other cirrhosis of liver: Secondary | ICD-10-CM | POA: Diagnosis not present

## 2016-03-16 ENCOUNTER — Ambulatory Visit
Admission: RE | Admit: 2016-03-16 | Discharge: 2016-03-16 | Disposition: A | Discharge status: Home / Self Care | Payer: Medicare Other | Source: Ambulatory Visit | Attending: Nurse Practitioner | Admitting: Nurse Practitioner

## 2016-03-16 DIAGNOSIS — K7460 Unspecified cirrhosis of liver: Secondary | ICD-10-CM | POA: Diagnosis not present

## 2016-03-16 DIAGNOSIS — K7469 Other cirrhosis of liver: Secondary | ICD-10-CM

## 2016-05-06 NOTE — Progress Notes (Deleted)
Subjective:   Jason Mullins is a 66 y.o. male who presents for Medicare Annual/Subsequent preventive examination.  Review of Systems:  No ROS.  Medicare Wellness Visit.     Sleep patterns: {SX; SLEEP PATTERNS:18802::"feels rested on waking","does not get up to void","gets up *** times nightly to void","sleeps *** hours nightly"}.   Home Safety/Smoke Alarms:   Living environment; residence and Firearm Safety: {Rehab home environment / accessibility:30080::"no firearms","firearms stored safely"}. Seat Belt Safety/Bike Helmet: Wears seat belt.   Counseling:   Eye Exam-  Dental-  Male:   CCS- last 01/24/13. No report on file.     PSA-  Lab Results  Component Value Date   PSA 0.63 05/06/2015        Objective:    Vitals: There were no vitals taken for this visit.  There is no height or weight on file to calculate BMI.  Tobacco History  Smoking Status  . Former Smoker  Smokeless Tobacco  . Never Used    Comment: 1 cigar a month     Counseling given: Not Answered   Past Medical History:  Diagnosis Date  . Asthma   . Hypertension    Past Surgical History:  Procedure Laterality Date  . CORRECTION HAMMER TOE     Family History  Problem Relation Age of Onset  . Cancer Mother   . Cancer Father    History  Sexual Activity  . Sexual activity: Yes    Outpatient Encounter Prescriptions as of 05/09/2016  Medication Sig  . amLODipine (NORVASC) 5 MG tablet Take 1 tablet (5 mg total) by mouth daily.  . Blood Pressure Monitoring (BLOOD PRESSURE MONITOR/WRIST) DEVI 1 Device by Does not apply route daily.  Marland Kitchen losartan-hydrochlorothiazide (HYZAAR) 100-12.5 MG tablet Take 1 tablet by mouth daily.  . ondansetron (ZOFRAN) 4 MG tablet Take 1 tablet (4 mg total) by mouth every 8 (eight) hours as needed for nausea or vomiting.   No facility-administered encounter medications on file as of 05/09/2016.     Activities of Daily Living In your present state of health, do you have any  difficulty performing the following activities: 05/13/2015  Hearing? N  Vision? N  Difficulty concentrating or making decisions? N  Walking or climbing stairs? N  Doing errands, shopping? N  Some recent data might be hidden    Patient Care Team: Pearline Cables, MD as PCP - General (Family Medicine)   Assessment:    Physical assessment deferred to PCP.  Exercise Activities and Dietary recommendations    Diet (meal preparation, eat out, water intake, caffeinated beverages, dairy products, fruits and vegetables): {Desc; diets:16563} Breakfast: Lunch:  Dinner:      Goals    None     Fall Risk Fall Risk  05/13/2015 11/05/2014 05/01/2014 01/30/2014  Falls in the past year? No No No No   Depression Screen PHQ 2/9 Scores 05/13/2015 11/05/2014 05/01/2014 01/30/2014  PHQ - 2 Score 0 0 0 0  Exception Documentation Patient refusal - - -    Cognitive Function        Immunization History  Administered Date(s) Administered  . Hepatitis A, Adult 11/05/2014  . Hepatitis B, adult 11/05/2014  . Pneumococcal Conjugate-13 05/06/2015  . Tdap 01/24/2010   Screening Tests Health Maintenance  Topic Date Due  . PNA vac Low Risk Adult (2 of 2 - PPSV23) 05/05/2016  . INFLUENZA VACCINE  08/24/2016  . TETANUS/TDAP  01/25/2020  . COLONOSCOPY  01/25/2023  . Hepatitis C Screening  Completed  Plan:   Follow-up w/ PCP as directed.  During the course of the visit the patient was educated and counseled about the following appropriate screening and preventive services:   Vaccines to include Pneumoccal, Influenza, Td, HCV  Cardiovascular Disease  Colorectal cancer screening  Diabetes screening  Prostate Cancer Screening  Glaucoma screening  Nutrition counseling   Smoking cessation counseling  Patient Instructions (the written plan) was given to the patient.    Starla Link, RN  05/06/2016

## 2016-05-06 NOTE — Progress Notes (Signed)
Pre visit review using our clinic review tool, if applicable. No additional management support is needed unless otherwise documented below in the visit note. 

## 2016-05-09 ENCOUNTER — Ambulatory Visit (INDEPENDENT_AMBULATORY_CARE_PROVIDER_SITE_OTHER): Payer: Medicare Other | Admitting: Family Medicine

## 2016-05-09 ENCOUNTER — Encounter: Payer: Self-pay | Admitting: Family Medicine

## 2016-05-09 VITALS — BP 158/94 | HR 54 | Temp 98.1°F | Ht 76.0 in | Wt 218.6 lb

## 2016-05-09 DIAGNOSIS — I10 Essential (primary) hypertension: Secondary | ICD-10-CM

## 2016-05-09 DIAGNOSIS — M25512 Pain in left shoulder: Secondary | ICD-10-CM

## 2016-05-09 DIAGNOSIS — Z131 Encounter for screening for diabetes mellitus: Secondary | ICD-10-CM

## 2016-05-09 DIAGNOSIS — D72819 Decreased white blood cell count, unspecified: Secondary | ICD-10-CM | POA: Diagnosis not present

## 2016-05-09 DIAGNOSIS — G8929 Other chronic pain: Secondary | ICD-10-CM

## 2016-05-09 DIAGNOSIS — Z130 Encounter for screening for diseases of the blood and blood-forming organs and certain disorders involving the immune mechanism: Secondary | ICD-10-CM | POA: Diagnosis not present

## 2016-05-09 DIAGNOSIS — E785 Hyperlipidemia, unspecified: Secondary | ICD-10-CM | POA: Diagnosis not present

## 2016-05-09 DIAGNOSIS — R439 Unspecified disturbances of smell and taste: Secondary | ICD-10-CM

## 2016-05-09 DIAGNOSIS — Z5181 Encounter for therapeutic drug level monitoring: Secondary | ICD-10-CM | POA: Diagnosis not present

## 2016-05-09 DIAGNOSIS — M25511 Pain in right shoulder: Secondary | ICD-10-CM | POA: Diagnosis not present

## 2016-05-09 DIAGNOSIS — Z125 Encounter for screening for malignant neoplasm of prostate: Secondary | ICD-10-CM

## 2016-05-09 LAB — COMPREHENSIVE METABOLIC PANEL
ALT: 11 U/L (ref 0–53)
AST: 16 U/L (ref 0–37)
Albumin: 4.4 g/dL (ref 3.5–5.2)
Alkaline Phosphatase: 62 U/L (ref 39–117)
BUN: 17 mg/dL (ref 6–23)
CO2: 29 mEq/L (ref 19–32)
Calcium: 9.8 mg/dL (ref 8.4–10.5)
Chloride: 103 mEq/L (ref 96–112)
Creatinine, Ser: 0.91 mg/dL (ref 0.40–1.50)
GFR: 106.96 mL/min (ref >60.00)
Glucose, Bld: 95 mg/dL (ref 70–99)
Potassium: 4.2 mEq/L (ref 3.5–5.1)
Sodium: 137 mEq/L (ref 135–145)
Total Bilirubin: 0.7 mg/dL (ref 0.2–1.2)
Total Protein: 7.5 g/dL (ref 6.0–8.3)

## 2016-05-09 LAB — LIPID PANEL
Cholesterol: 183 mg/dL (ref 0–200)
HDL: 61.4 mg/dL (ref >39.00)
LDL Cholesterol: 111 mg/dL — ABNORMAL HIGH (ref 0–99)
NonHDL: 121.59
Total CHOL/HDL Ratio: 3
Triglycerides: 52 mg/dL (ref 0.0–149.0)
VLDL: 10.4 mg/dL (ref 0.0–40.0)

## 2016-05-09 LAB — CBC
HCT: 43.6 % (ref 39.0–52.0)
Hemoglobin: 14.6 g/dL (ref 13.0–17.0)
MCHC: 33.6 g/dL (ref 30.0–36.0)
MCV: 90.6 fl (ref 78.0–100.0)
Platelets: 195 10*3/uL (ref 150.0–400.0)
RBC: 4.81 Mil/uL (ref 4.22–5.81)
RDW: 13.6 % (ref 11.5–15.5)
WBC: 3.6 10*3/uL — ABNORMAL LOW (ref 4.0–10.5)

## 2016-05-09 LAB — PSA, MEDICARE: PSA: 0.61 ng/ml (ref 0.10–4.00)

## 2016-05-09 MED ORDER — AMLODIPINE BESYLATE 10 MG PO TABS: 10.0000 mg | ORAL_TABLET | Freq: Every day | ORAL | 3 refills | Status: DC

## 2016-05-09 NOTE — Patient Instructions (Addendum)
I am going to refer you to an orthopedist to look at your shoulders for you. Please take your copy of your MRI report with you We will check labs for you today and I will be in touch with your results asap   Please keep an eye on your BP at home- we would like to see you running no higher than 135/85 on average.  Please let me know if you tend to be higher than this!    Try using an OTC nasal steroid spray (flonase allergy relief) for a couple of weeks.  If this does not help with your smell/ taste changes please let me know

## 2016-05-09 NOTE — Progress Notes (Signed)
[facility] [address] [place], [place] [address] [phone]  Date:  05/09/2016   Name:  Jason Mullins   DOB:  Mar 02, 1950   MRN:  [id number]  PCP:  Abbe Amsterdam, MD    Chief Complaint: Follow-up (Pt last seen 04/2015. c/o change with sense of taste and smell x 6 months. )   History of Present Illness:  Jason Mullins is a 65 y.o. very pleasant male patient who presents with the following:  Last seen by myself for illness about a year ago.  Here today with complaint of change in his taste and smell.   He did complete Harvoni for hepatitis C- finished this about a year ago  For about he last 6 months he has noted that he does nor smell things as well as he used to.  Also he does not taste as well.  He did have asthma as a child, but this went away at age 38.  He did used to have hay-fever but even this has resolved.  He does not feel like his nose has been particularly congested.  He feels like his taste is stronger than his smell senation Wt Readings from Last 3 Encounters:  05/09/16 218 lb 9.6 oz (99.2 kg)  05/13/15 213 lb 6.4 oz (96.8 kg)  05/06/15 222 lb 3.2 oz (100.8 kg)   No other neurological sx- no numbness, weakness, slurred speech Last labs about a year ago- looked fine  He has noted pain in both of his shoulders, the left is perhaps worse.  He does exercise at the gym and plays golf.  His shoulders have bothered him off an on for several years.  He does not have pain with golfing, but finds that certain weight lifting exercises hurt him, and he has pain at night.   He did have an MRI of he left shoulder back in 2014- he had a supraspinatous tear. He does not remember much about this MRI and does not think that he had any particular treatment except for PT.  So far he has not found PT to be helfful  He is fasting today Had prevnar 13 in 2017- a year ago. However he may have had a reaction to this shot as he  got sick with a possible viral illness vs reaction shortly after.   Declines pneumovax today  BP Readings from Last 3 Encounters:  05/09/16 (!) 158/94  05/13/15 (!) 148/98  05/06/15 120/80   He does monitor his BP at home and notes that it generlaly runs 150/90 Will have him increase his amlodipine to 10 mg; he will use up what he has and I will send in a new rx for him   Patient Active Problem List   Diagnosis Date Noted  . Benign essential HTN 01/30/2014  . Hepatitis C antibody test positive 01/30/2014  . History of Sleep apnea 01/30/2014    Past Medical History:  Diagnosis Date  . Asthma   . Hypertension     Past Surgical History:  Procedure Laterality Date  . CORRECTION HAMMER TOE      Social History  Substance Use Topics  . Smoking status: Former Games developer  . Smokeless tobacco: Never Used     Comment: 1 cigar a month  . Alcohol use 0.0 oz/week     Comment: 1-2 times a week.    Family History  Problem Relation Age of Onset  . Cancer Mother   .  Cancer Father     No Known Allergies  Medication list has been reviewed and updated.  Current Outpatient Prescriptions on File Prior to Visit  Medication Sig Dispense Refill  . losartan-hydrochlorothiazide (HYZAAR) 100-12.5 MG tablet Take 1 tablet by mouth daily. 90 tablet 3   No current facility-administered medications on file prior to visit.     Review of Systems:  As per HPI- otherwise negative.   Physical Examination: Vitals:   05/09/16 1102  BP: (!) 158/94  Pulse: (!) 54  Temp: 98.1 F (36.7 C)   Vitals:   05/09/16 1102  Weight: 218 lb 9.6 oz (99.2 kg)  Height:  (1.93 m)   Body mass index is 26.61 kg/m. Ideal Body Weight: Weight in (lb) to have BMI = 25: 205  GEN: WDWN, NAD, Non-toxic, A & O x 3, looks well and healthy  HEENT: Atraumatic, Normocephalic. Neck supple. No masses, No LAD.  Bilateral TM wnl, oropharynx normal.  PEERL,EOMI.   Ears and Nose: No external deformity. CV: RRR, No  M/G/R. No JVD. No thrill. No extra heart sounds. PULM: CTA B, no wheezes, crackles, rhonchi. No retractions. No resp. distress. No accessory muscle use. ABD: S, NT, ND. No rebound. No HSM. EXTR: No c/c/e NEURO Normal gait.  PSYCH: Normally interactive. Conversant. Not depressed or anxious appearing.  Calm demeanor.  Both shoulders have normal ROM, but he has pain with external rotation bilaterally.  The left shoulder has weakness to empty can testing and pain with Hawkin's testing   Assessment and Plan: Chronic pain of both shoulders - Plan: Ambulatory referral to Orthopedic Surgery  Smell and taste disorder  Essential hypertension - Plan: amLODipine (NORVASC) 10 MG tablet  Screening for prostate cancer - Plan: PSA, Medicare  Screening for diabetes mellitus - Plan: Comprehensive metabolic panel  Medication monitoring encounter - Plan: CBC, Comprehensive metabolic panel  Screening for deficiency anemia - Plan: CBC  Dyslipidemia - Plan: Lipid panel  Increase amlodipine to 10 mg- he will monitor his BP at home and let me know if not under control Referral to ortho regarding his shoulders  Received labs as below, letter to pt  Your labs are overall very good- your PSA is stable, cholesterol looks fine.  Your white blood cell count is minimally low.  This is likely benign, but I would like to have you repeat this as a lab visit only in 1-2 months.  Otherwise we are fine to visit in about one year.  Take care! Also reminded him with labs to keep me posted about BP with higher dose of amlodipine  Signed Abbe Amsterdam, MD

## 2016-05-16 ENCOUNTER — Telehealth: Payer: Self-pay | Admitting: Family Medicine

## 2016-05-16 ENCOUNTER — Other Ambulatory Visit: Payer: Self-pay | Admitting: Family Medicine

## 2016-05-16 DIAGNOSIS — M25512 Pain in left shoulder: Secondary | ICD-10-CM | POA: Diagnosis not present

## 2016-05-16 DIAGNOSIS — I10 Essential (primary) hypertension: Secondary | ICD-10-CM

## 2016-05-16 DIAGNOSIS — M25511 Pain in right shoulder: Secondary | ICD-10-CM | POA: Diagnosis not present

## 2016-05-16 NOTE — Telephone Encounter (Signed)
Caller name: Relationship to patient: Self Can be reached: [phone]  Pharmacy:  [id number] - [place], [place] - [address] AT [address]OSS [address] [phone] (Phone) [phone] (Fax)     Reason for call: Refill losartan-hydrochlorothiazide (HYZAAR) 100-12.5 MG tablet [[id number]]

## 2016-05-17 ENCOUNTER — Other Ambulatory Visit: Payer: Self-pay | Admitting: Emergency Medicine

## 2016-05-17 DIAGNOSIS — I10 Essential (primary) hypertension: Secondary | ICD-10-CM

## 2016-05-17 MED ORDER — LOSARTAN POTASSIUM-HCTZ 100-12.5 MG PO TABS: 1.0000 | ORAL_TABLET | Freq: Every day | ORAL | 3 refills | Status: DC

## 2016-05-17 NOTE — Telephone Encounter (Signed)
Refill has been sent per pt request

## 2016-06-21 ENCOUNTER — Ambulatory Visit (INDEPENDENT_AMBULATORY_CARE_PROVIDER_SITE_OTHER): Payer: Medicare Other | Admitting: *Deleted

## 2016-06-21 ENCOUNTER — Encounter: Payer: Self-pay | Admitting: Family Medicine

## 2016-06-21 ENCOUNTER — Encounter: Payer: Self-pay | Admitting: *Deleted

## 2016-06-21 VITALS — BP 124/84 | HR 55 | Ht 74.0 in | Wt 214.8 lb

## 2016-06-21 DIAGNOSIS — Z0000 Encounter for general adult medical examination without abnormal findings: Secondary | ICD-10-CM | POA: Diagnosis not present

## 2016-06-21 DIAGNOSIS — D72819 Decreased white blood cell count, unspecified: Secondary | ICD-10-CM | POA: Diagnosis not present

## 2016-06-21 LAB — CBC
HCT: 41.9 % (ref 39.0–52.0)
Hemoglobin: 14.4 g/dL (ref 13.0–17.0)
MCHC: 34.3 g/dL (ref 30.0–36.0)
MCV: 88.9 fl (ref 78.0–100.0)
Platelets: 218 10*3/uL (ref 150.0–400.0)
RBC: 4.72 Mil/uL (ref 4.22–5.81)
RDW: 13.3 % (ref 11.5–15.5)
WBC: 4 10*3/uL (ref 4.0–10.5)

## 2016-06-21 NOTE — Patient Instructions (Signed)
Jason Mullins , Thank you for taking time to come for your Medicare Wellness Visit. I appreciate your ongoing commitment to your health goals. Please review the following plan we discussed and let me know if I can assist you in the future.   Please go to the lab for your follow-up blood work.  Bring a copy of your advance directives to your next office visit.  Consider the new shingles vaccine, Shingrix. You can check with your pharmacy to see if they have it in stock.  These are the goals we discussed: Goals    . Decrease shoulder pain       This is a list of the screening recommended for you and due dates:  Health Maintenance  Topic Date Due  . Pneumonia vaccines (2 of 2 - PPSV23) 05/09/2021*  . Flu Shot  08/24/2016  . Tetanus Vaccine  01/25/2020  . Colon Cancer Screening  01/25/2023  .  Hepatitis C: One time screening is recommended by Center for Disease Control  (CDC) for  adults born from [birth year] through 1966.   Completed  *Topic was postponed. The date shown is not the original due date.    Preventive Care 84 Years and Older, Male Preventive care refers to lifestyle choices and visits with your health care provider that can promote health and wellness. What does preventive care include?  A yearly physical exam. This is also called an annual well check.  Dental exams once or twice a year.  Routine eye exams. Ask your health care provider how often you should have your eyes checked.  Personal lifestyle choices, including:  Daily care of your teeth and gums.  Regular physical activity.  Eating a healthy diet.  Avoiding tobacco and drug use.  Limiting alcohol use.  Practicing safe sex.  Taking low doses of aspirin every day.  Taking vitamin and mineral supplements as recommended by your health care provider. What happens during an annual well check? The services and screenings done by your health care provider during your annual well check will depend on your age,  overall health, lifestyle risk factors, and family history of disease. Counseling  Your health care provider may ask you questions about your:  Alcohol use.  Tobacco use.  Drug use.  Emotional well-being.  Home and relationship well-being.  Sexual activity.  Eating habits.  History of falls.  Memory and ability to understand (cognition).  Work and work Statistician. Screening  You may have the following tests or measurements:  Height, weight, and BMI.  Blood pressure.  Lipid and cholesterol levels. These may be checked every 5 years, or more frequently if you are over 44 years old.  Skin check.  Lung cancer screening. You may have this screening every year starting at age 66 if you have a 30-pack-year history of smoking and currently smoke or have quit within the past 15 years.  Fecal occult blood test (FOBT) of the stool. You may have this test every year starting at age 2.  Flexible sigmoidoscopy or colonoscopy. You may have a sigmoidoscopy every 5 years or a colonoscopy every 10 years starting at age 41.  Prostate cancer screening. Recommendations will vary depending on your family history and other risks.  Hepatitis C blood test.  Hepatitis B blood test.  Sexually transmitted disease (STD) testing.  Diabetes screening. This is done by checking your blood sugar (glucose) after you have not eaten for a while (fasting). You may have this done every 1-3 years.  Abdominal  aortic aneurysm (AAA) screening. You may need this if you are a current or former smoker.  Osteoporosis. You may be screened starting at age 74 if you are at high risk. Talk with your health care provider about your test results, treatment options, and if necessary, the need for more tests. Vaccines  Your health care provider may recommend certain vaccines, such as:  Influenza vaccine. This is recommended every year.  Tetanus, diphtheria, and acellular pertussis (Tdap, Td) vaccine. You may  need a Td booster every 10 years.  Varicella vaccine. You may need this if you have not been vaccinated.  Zoster vaccine. You may need this after age 21.  Measles, mumps, and rubella (MMR) vaccine. You may need at least one dose of MMR if you were born in 1957 or later. You may also need a second dose.  Pneumococcal 13-valent conjugate (PCV13) vaccine. One dose is recommended after age 76.  Pneumococcal polysaccharide (PPSV23) vaccine. One dose is recommended after age 59.  Meningococcal vaccine. You may need this if you have certain conditions.  Hepatitis A vaccine. You may need this if you have certain conditions or if you travel or work in places where you may be exposed to hepatitis A.  Hepatitis B vaccine. You may need this if you have certain conditions or if you travel or work in places where you may be exposed to hepatitis B.  Haemophilus influenzae type b (Hib) vaccine. You may need this if you have certain risk factors. Talk to your health care provider about which screenings and vaccines you need and how often you need them. This information is not intended to replace advice given to you by your health care provider. Make sure you discuss any questions you have with your health care provider. Document Released: 02/06/2015 Document Revised: 09/30/2015 Document Reviewed: 11/11/2014 Elsevier Interactive Patient Education  2017 [employer].

## 2016-06-21 NOTE — Progress Notes (Signed)
Subjective:   Jason Mullins is a 66 y.o. male who presents for Initial Medicare Annual preventive examination.  At last visit, his amlodipine was increased to 10 mg. Since then, home BPs have been running about 114/70s and he tolerating amlodipine with no side effects.  His WBC count was increased at check last month. He will get this rechecked while he his here today.   Review of Systems:  No ROS.  Medicare Wellness Visit.  Cardiac Risk Factors include: advanced age (>51men, >41 women);hypertension;male gender  Sleep patterns: feels rested on waking, gets up 0 times nightly to void and sleeps 6-7 hours nightly. H/o of sleep apnea and wore CPAP for several years, then was told he did not need it upon repeat sleep study. Is sometimes bothered by shoulder pain at night, but otherwise sleeps well.  Home Safety/Smoke Alarms:Feels safe in home. Smoke alarms in place. Security system in place. Living environment; residence and Firearm Safety: Lives w/ wife. 2-story house, firearms stored safely.  Seat Belt Safety/Bike Helmet: Wears seat belt.   Counseling:   Eye Exam- does not follow w/ eye doctor regularly Dental- Follows w/ Dr. Glenda Chroman twice yearly  Male:   CCS- last 01/24/13. No report on file.    PSA-  Lab Results  Component Value Date   PSA 0.61 05/09/2016   PSA 0.63 05/06/2015       Objective:    Vitals: BP 124/84   Pulse (!) 55   Ht 6\' 2"  (1.88 m)   Wt 214 lb 12.8 oz (97.4 kg)   SpO2 98%   BMI 27.58 kg/m   Body mass index is 27.58 kg/m.  Tobacco History  Smoking Status  . Former Smoker  Smokeless Tobacco  . Never Used    Comment: 1 cigar a month     Counseling given: Not Answered   Past Medical History:  Diagnosis Date  . Asthma   . Hypertension    Past Surgical History:  Procedure Laterality Date  . CORRECTION HAMMER TOE     Family History  Problem Relation Age of Onset  . Cancer Mother   . Cancer Father    History  Sexual Activity  . Sexual  activity: Yes    Outpatient Encounter Prescriptions as of 06/21/2016  Medication Sig  . amLODipine (NORVASC) 10 MG tablet Take 1 tablet (10 mg total) by mouth daily.  Marland Kitchen losartan-hydrochlorothiazide (HYZAAR) 100-12.5 MG tablet Take 1 tablet by mouth daily.   No facility-administered encounter medications on file as of 06/21/2016.     Activities of Daily Living In your present state of health, do you have any difficulty performing the following activities: 06/21/2016  Hearing? N  Vision? N  Difficulty concentrating or making decisions? N  Walking or climbing stairs? N  Dressing or bathing? N  Doing errands, shopping? N  Preparing Food and eating ? N  Using the Toilet? N  Managing your Medications? N  Managing your Finances? N  Housekeeping or managing your Housekeeping? N  Some recent data might be hidden    Patient Care Team: Copland, Gwenlyn Found, MD as PCP - General (Family Medicine) Teryl Lucy, MD as Consulting Physician (Orthopedic Surgery) Manuela Schwartz, DDS as Consulting Physician (Dentistry)   Assessment:    Physical assessment deferred to PCP.  Exercise Activities and Dietary recommendations Current Exercise Habits: Structured exercise class, Type of exercise: Other - see comments (Silver Sneakers, golf), Frequency (Times/Week): 4  Diet (meal preparation, eat out, water intake, caffeinated beverages, dairy  products, fruits and vegetables): in general, a "healthy" diet  , well balanced, on average, 2 meals per day. Eats lots of seafood, tries to avoid other meats. Eats lots of green vegetables. Drinks 2-3 8oz glasses of water daily.    Goals    . Decrease shoulder pain      Fall Risk Fall Risk  06/21/2016 05/13/2015 11/05/2014 05/01/2014 01/30/2014  Falls in the past year? No No No No No   Depression Screen PHQ 2/9 Scores 06/21/2016 05/13/2015 11/05/2014 05/01/2014  PHQ - 2 Score 0 0 0 0  Exception Documentation - Patient refusal - -    Cognitive Function MMSE - Mini  Mental State Exam 06/21/2016  Orientation to time 4  Orientation to Place 5  Registration 3  Attention/ Calculation 5  Recall 3  Language- name 2 objects 2  Language- repeat 1  Language- follow 3 step command 3  Language- read & follow direction 1  Write a sentence 1  Copy design 1  Total score 29        Immunization History  Administered Date(s) Administered  . Hepatitis A, Adult 11/05/2014  . Hepatitis B, adult 11/05/2014  . Pneumococcal Conjugate-13 05/06/2015  . Tdap 01/24/2010   Screening Tests Health Maintenance  Topic Date Due  . PNA vac Low Risk Adult (2 of 2 - PPSV23) 05/09/2021 (Originally 05/05/2016)  . INFLUENZA VACCINE  08/24/2016  . TETANUS/TDAP  01/25/2020  . COLONOSCOPY  01/25/2023  . Hepatitis C Screening  Completed      Plan:    Follow-up w/ PCP as directed in 1 year.   Bring a copy of your advance directives to your next office visit.  Continue to eat heart healthy diet (full of fruits, vegetables, whole grains, lean protein, water--limit salt, fat, and sugar intake) and increase physical activity as tolerated.  I have personally reviewed and noted the following in the patient's chart:   . Medical and social history . Use of alcohol, tobacco or illicit drugs  . Current medications and supplements . Functional ability and status . Nutritional status . Physical activity . Advanced directives . List of other physicians . Vitals . Screenings to include cognitive, depression, and falls . Referrals and appointments  In addition, I have reviewed and discussed with patient certain preventive protocols, quality metrics, and best practice recommendations. A written personalized care plan for preventive services as well as general preventive health recommendations were provided to patient.     Starla Linkarolyn J Tylah Mancillas, RN  06/21/2016

## 2016-06-21 NOTE — Addendum Note (Signed)
Addended by: Harley AltoPRICE, KRISTY M on: 06/21/2016 10:18 AM   Modules accepted: Orders

## 2016-08-30 ENCOUNTER — Other Ambulatory Visit (HOSPITAL_COMMUNITY): Payer: Self-pay | Admitting: Nurse Practitioner

## 2016-08-30 DIAGNOSIS — B182 Chronic viral hepatitis C: Secondary | ICD-10-CM

## 2016-09-13 ENCOUNTER — Ambulatory Visit (HOSPITAL_COMMUNITY): Payer: Medicare Other

## 2016-10-12 ENCOUNTER — Ambulatory Visit (HOSPITAL_COMMUNITY): Payer: Medicare Other

## 2016-11-24 ENCOUNTER — Telehealth: Payer: Self-pay | Admitting: Family Medicine

## 2016-11-24 NOTE — Telephone Encounter (Signed)
Copied from CRM #[id number]. Topic: Medicare AWV >> Nov 24, 2016 11:38 AM Lynnell ChadFoskey, Sheneka L wrote: CRM for notification. See Telephone encounter for:  11/24/16.  Spoke with pt regarding change in AWV appt time. Per pt okay to change appt to June 2019. SF

## 2016-12-17 IMAGING — US US ABDOMEN LIMITED
1 series · 14 of 25 positions shown · non-contrast
Comparison: 12/29/2014

CLINICAL DATA: Cirrhosis of unspecified type, hepatitis-C, former
smoker, hypertension

EXAM:
US ABDOMEN LIMITED - RIGHT UPPER QUADRANT

[Series 1: us abdomen limited · 0.24mm/px · 14 of 52 slices shown]
[im 1/52]
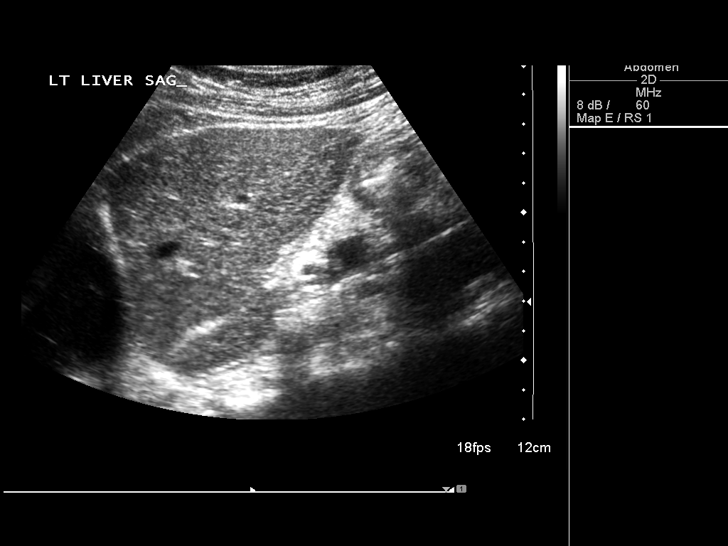
[im 5/52]
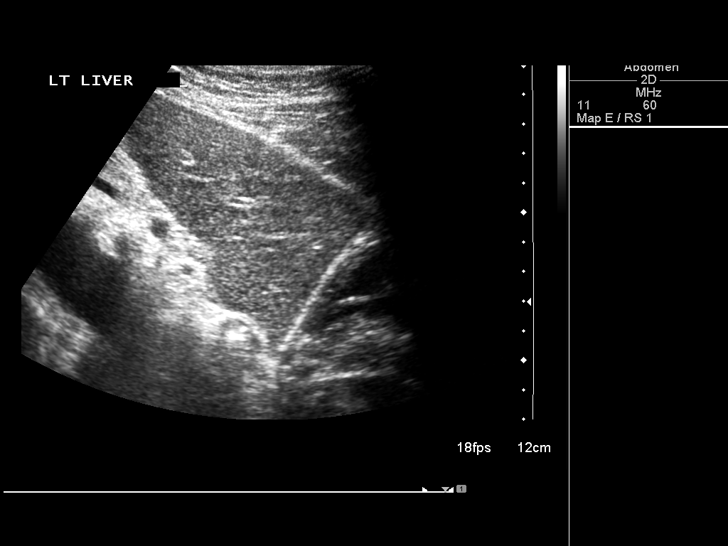
[im 9/52]
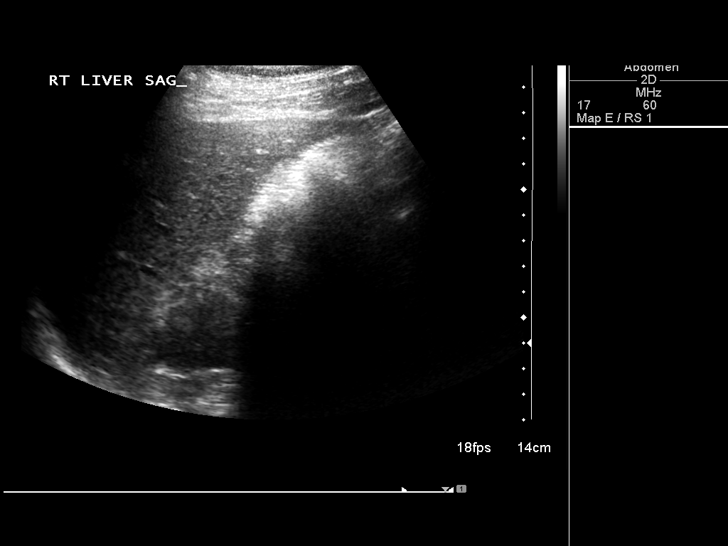
[im 13/52]
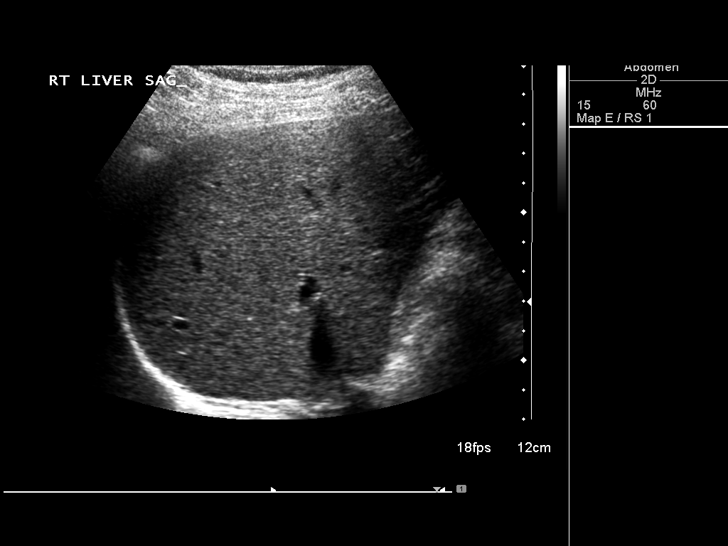
[im 18/52]
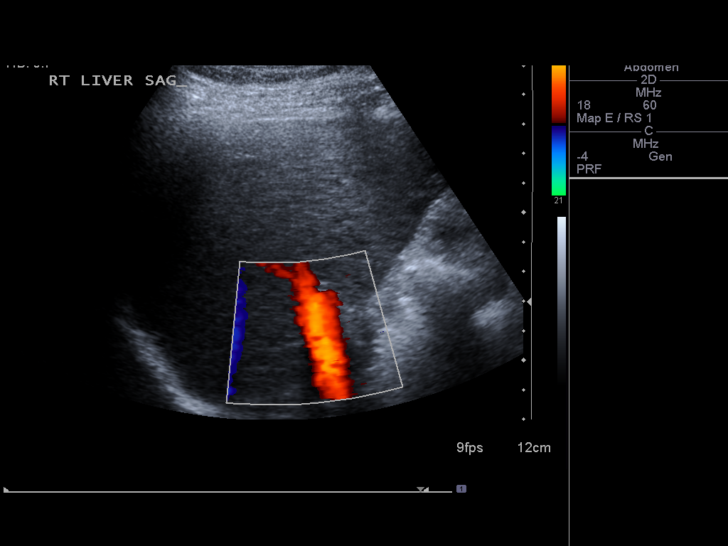
[im 20/52]
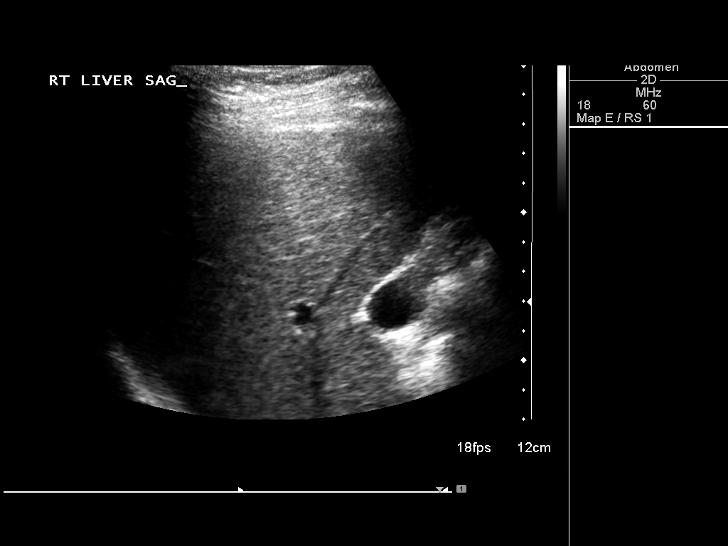
[im 24/52]
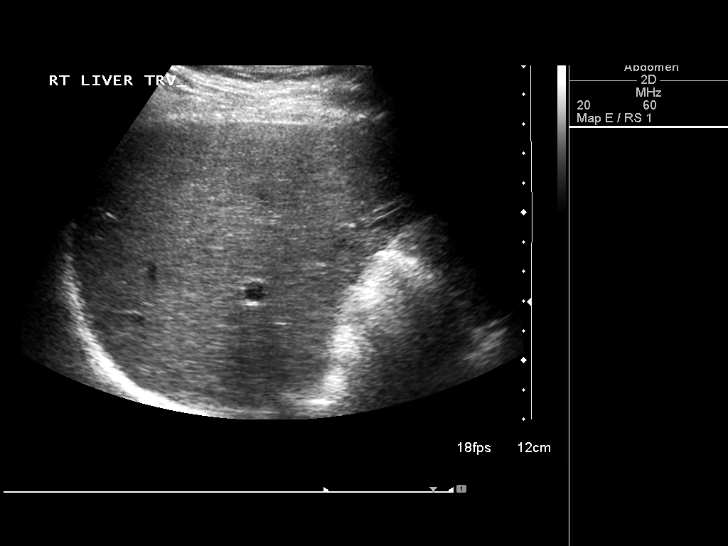
[im 28/52]
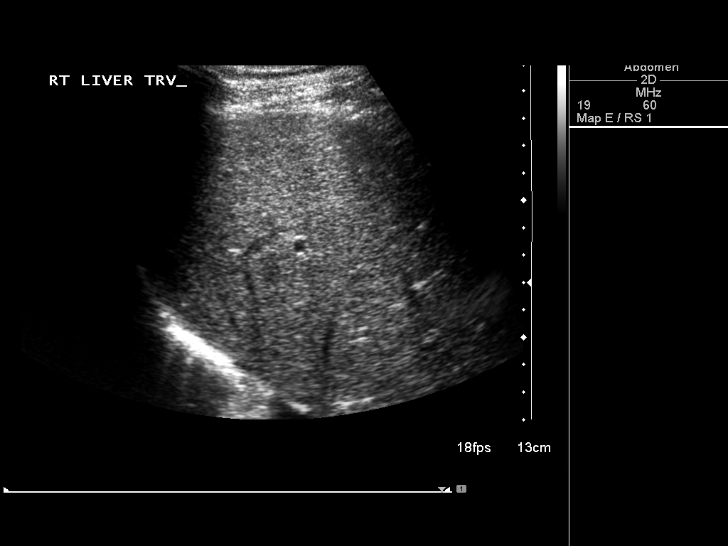
[im 32/52]
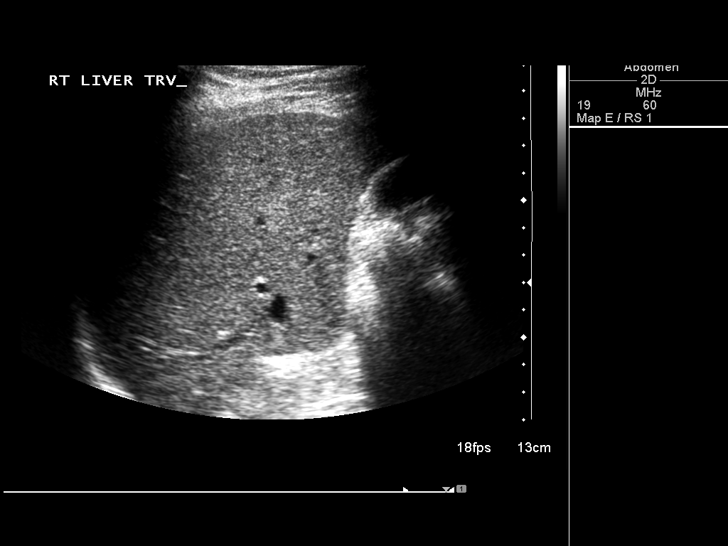
[im 35/52]
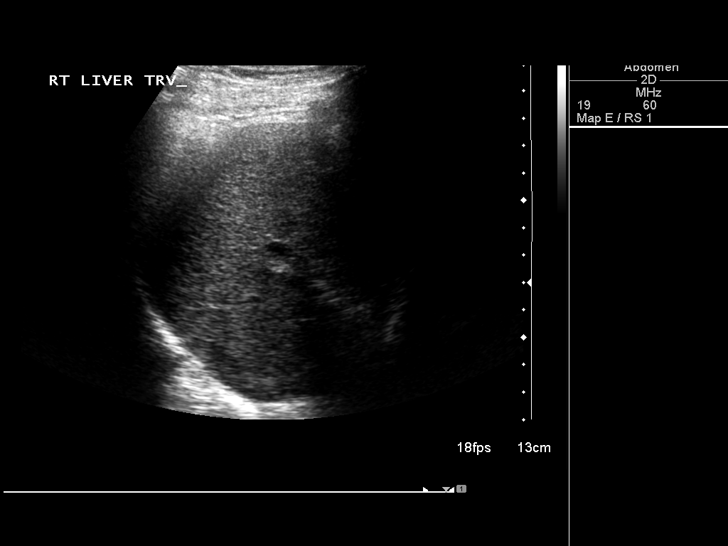
[im 39/52]
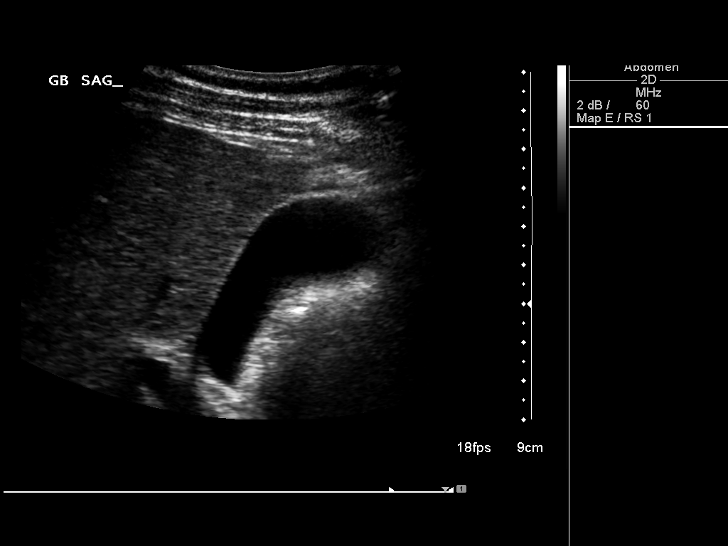
[im 43/52]
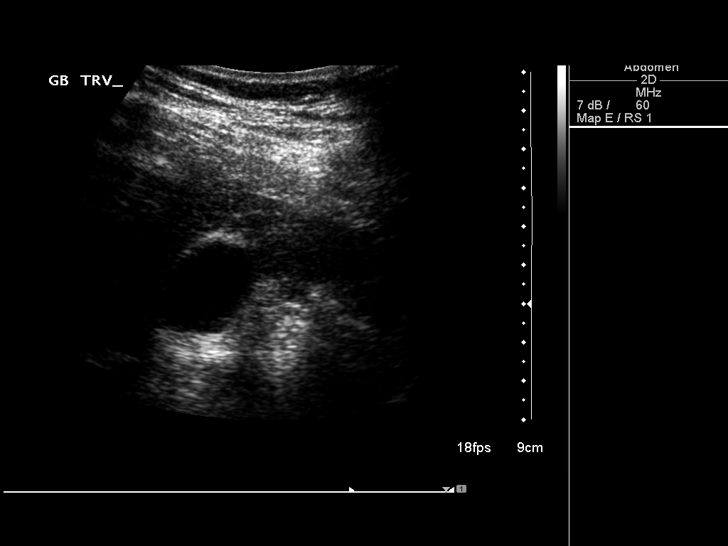
[im 47/52]
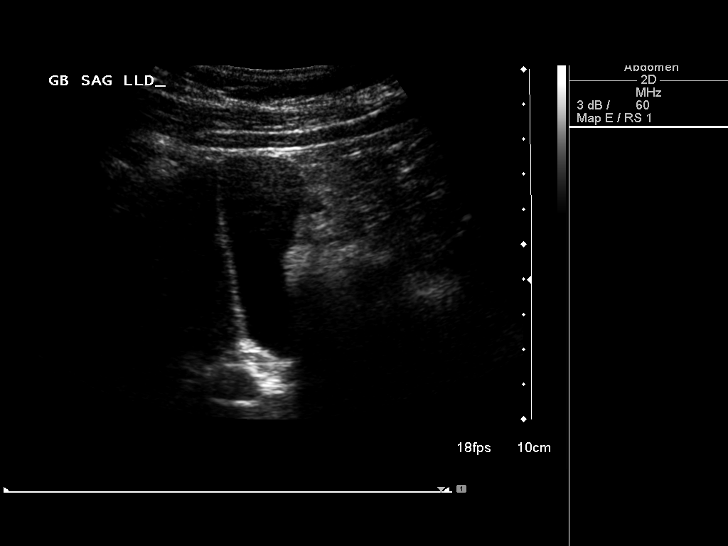
[im 52/52]
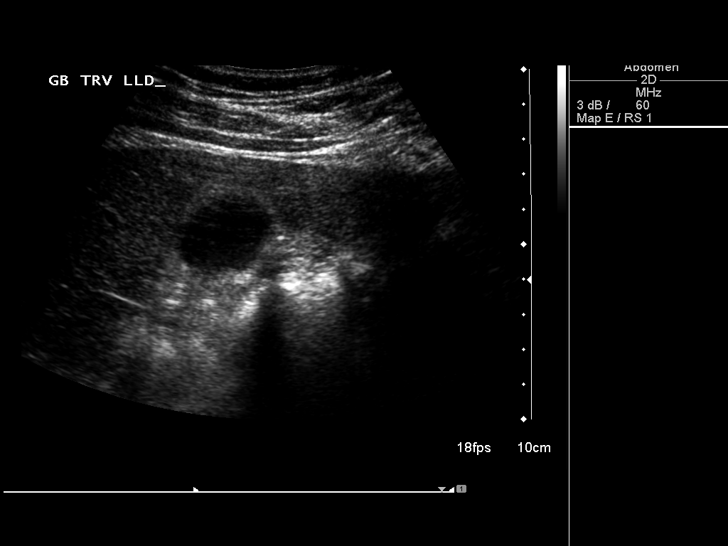

[14 of 25 positions shown; findings below may reference images not displayed]

FINDINGS: Gallbladder:

Normally distended without stones or wall thickening. No
pericholecystic fluid or sonographic Murphy sign.

Common bile duct:

Diameter: 5 mm diameter , normal

Liver:

Normal echogenicity without mass.  Hepatopetal portal venous flow.

No RIGHT upper quadrant free fluid.
IMPRESSION: No acute abnormalities.

## 2017-02-20 ENCOUNTER — Telehealth: Payer: Self-pay | Admitting: Family Medicine

## 2017-02-20 NOTE — Telephone Encounter (Signed)
We received a message that pt wants us to schedule an office visit appt with Dr. Patsy Lageropland. I called pt but received his voicemail and I asked him to call us back to schedule this appointment.

## 2017-02-22 NOTE — Progress Notes (Signed)
[facility] [place] [address], [address] [place], [place] [address] [phone] [phone]  Date:  02/23/2017   Name:  Jason CockingJames Mandley   DOB:  10/17/1950   MRN:  [id number]  PCP:  Pearline Cablesopland, Laronn Devonshire C, MD    Chief Complaint: URI   History of Present Illness:  Jason CockingJames Gugliotta is a 67 y.o. very pleasant male patient who presents with the following:  Here today with concern of illness history of HTN, sleep apnea  I last saw him in April- he had concern of change in his taste and smell at that time   He notes upper respiratory sx for 5-6 weeks; will wax and wane but not resolve He notes cough, productive of phlegm He is able to sleep ok His sx are actually more in his sinuses now He has not had any fever or runny nose No headache He is blowing a lot of mucus from his nose No vomiting or diarrhea Appetite is normal He is able to do all his activities, including golf  He thinks that his taste and smell have improved but not totally back to normal  He took Harvoni for hep C and is cured!   He is taking his BP meds but not all that consistently.  Admits that he did take today, but has not taken in the last few days   No Le edema  Wt Readings from Last 3 Encounters:  02/23/17 221 lb 9.6 oz (100.5 kg)  06/21/16 214 lb 12.8 oz (97.4 kg)  05/09/16 218 lb 9.6 oz (99.2 kg)      Patient Active Problem List   Diagnosis Date Noted  . Benign essential HTN 01/30/2014  . Hepatitis C antibody test positive 01/30/2014  . History of Sleep apnea 01/30/2014    Past Medical History:  Diagnosis Date  . Asthma   . Hypertension     Past Surgical History:  Procedure Laterality Date  . CORRECTION HAMMER TOE      Social History   Tobacco Use  . Smoking status: Former Games developermoker  . Smokeless tobacco: Never Used  . Tobacco comment: 1 cigar a month  Substance Use Topics  . Alcohol use: Yes    Alcohol/week: 1.2 - 2.4 oz    Types: 2 - 4 Cans of beer per  week  . Drug use: No    Family History  Problem Relation Age of Onset  . Cancer Mother   . Cancer Father     No Known Allergies  Medication list has been reviewed and updated.  Current Outpatient Medications on File Prior to Visit  Medication Sig Dispense Refill  . amLODipine (NORVASC) 10 MG tablet Take 1 tablet (10 mg total) by mouth daily. 90 tablet 3  . losartan-hydrochlorothiazide (HYZAAR) 100-12.5 MG tablet Take 1 tablet by mouth daily. 90 tablet 3   No current facility-administered medications on file prior to visit.     Review of Systems:  As per HPI- otherwise negative.   Physical Examination: Vitals:   02/23/17 1043  BP: (!) 164/97  Pulse: 60  Resp: 18  Temp: 97.9 F (36.6 C)  SpO2: 100%   Vitals:   02/23/17 1043  Weight: 221 lb 9.6 oz (100.5 kg)  Height: 6\' 4"  (1.93 m)   Body mass index is 26.97 kg/m. Ideal Body Weight: Weight in (lb) to have BMI = 25: 205  GEN: WDWN, NAD, Non-toxic, A & O x 3, tall build, looks well  HEENT: Atraumatic, Normocephalic. Neck supple. No masses, No LAD.  Bilateral TM wnl, oropharynx normal.  PEERL,EOMI.   Ears and Nose: No external deformity. CV: RRR, No M/G/R. No JVD. No thrill. No extra heart sounds. PULM:  no wheezes, crackles. No retractions. No resp. distress. No accessory muscle use.  Mild congestion heard in lung bases bilaterally ABD: S, NT, ND EXTR: No c/c/e NEURO Normal gait.  PSYCH: Normally interactive. Conversant. Not depressed or anxious appearing.  Calm demeanor.    Assessment and Plan: Essential hypertension  Bronchitis - Plan: doxycycline (VIBRAMYCIN) 100 MG capsule  Frontal sinusitis, unspecified chronicity - Plan: doxycycline (VIBRAMYCIN) 100 MG capsule  Here today with persistent sx of cough and nasal congestion for several weeks Treat with doxycycline for 10 days- he will let me know if not better soon Encouraged him to take his BP meds on a regular basis to control his HTN He will see me  for a CPE soon   Signed Abbe Amsterdam, MD

## 2017-02-23 ENCOUNTER — Ambulatory Visit (INDEPENDENT_AMBULATORY_CARE_PROVIDER_SITE_OTHER): Payer: Medicare Other | Admitting: Family Medicine

## 2017-02-23 ENCOUNTER — Encounter: Payer: Self-pay | Admitting: Family Medicine

## 2017-02-23 VITALS — BP 160/90 | HR 60 | Temp 97.9°F | Resp 18 | Ht 76.0 in | Wt 221.6 lb

## 2017-02-23 DIAGNOSIS — J40 Bronchitis, not specified as acute or chronic: Secondary | ICD-10-CM | POA: Diagnosis not present

## 2017-02-23 DIAGNOSIS — J321 Chronic frontal sinusitis: Secondary | ICD-10-CM | POA: Diagnosis not present

## 2017-02-23 DIAGNOSIS — I10 Essential (primary) hypertension: Secondary | ICD-10-CM | POA: Diagnosis not present

## 2017-02-23 MED ORDER — DOXYCYCLINE HYCLATE 100 MG PO CAPS: 100.0000 mg | ORAL_CAPSULE | Freq: Two times a day (BID) | ORAL | 0 refills | Status: DC

## 2017-02-23 NOTE — Patient Instructions (Addendum)
Please start back on your BP meds on a consistent basis for better control We are going to use doxycycline twice a day for 10 days for your sinus/ chest symptoms.   Please let me know if this is not helpful!   Please come and see me in April/ May for a physical exam

## 2017-03-02 ENCOUNTER — Ambulatory Visit: Payer: Medicare Other | Admitting: Family Medicine

## 2017-04-09 NOTE — Progress Notes (Deleted)
[facility] [place] [address], [address] [place], [place] [address] [phone]  Date:  04/10/2017   Name:  Domingo CockingJames Petitfrere   DOB:  06-19-1950   MRN:  [id number]  PCP:  Pearline Cablesopland, Shakena Callari C, MD    Chief Complaint: No chief complaint on file.   History of Present Illness:  Domingo CockingJames Teagle is a 67 y.o. very pleasant male patient who presents with the following:  History of HTN, OSA, hep C- now cured I last saw him at the end of January with illness Here today with persistent sx of cough and nasal congestion for several weeks Treat with doxycycline for 10 days- he will let me know if not better soon Encouraged him to take his BP meds on a regular basis to control his HTN He will see me for a CPE soon   Here today to discuss   Patient Active Problem List   Diagnosis Date Noted  . Benign essential HTN 01/30/2014  . Hepatitis C antibody test positive 01/30/2014  . History of Sleep apnea 01/30/2014    Past Medical History:  Diagnosis Date  . Asthma   . Hypertension     Past Surgical History:  Procedure Laterality Date  . CORRECTION HAMMER TOE      Social History   Tobacco Use  . Smoking status: Former Games developermoker  . Smokeless tobacco: Never Used  . Tobacco comment: 1 cigar a month  Substance Use Topics  . Alcohol use: Yes    Alcohol/week: 1.2 - 2.4 oz    Types: 2 - 4 Cans of beer per week  . Drug use: No    Family History  Problem Relation Age of Onset  . Cancer Mother   . Cancer Father     No Known Allergies  Medication list has been reviewed and updated.  Current Outpatient Medications on File Prior to Visit  Medication Sig Dispense Refill  . amLODipine (NORVASC) 10 MG tablet Take 1 tablet (10 mg total) by mouth daily. 90 tablet 3  . doxycycline (VIBRAMYCIN) 100 MG capsule Take 1 capsule (100 mg total) by mouth 2 (two) times daily. 20 capsule 0  . losartan-hydrochlorothiazide (HYZAAR) 100-12.5 MG tablet Take 1 tablet  by mouth daily. 90 tablet 3   No current facility-administered medications on file prior to visit.     Review of Systems:  As per HPI- otherwise negative.   Physical Examination: There were no vitals filed for this visit. There were no vitals filed for this visit. There is no height or weight on file to calculate BMI. Ideal Body Weight:    GEN: WDWN, NAD, Non-toxic, A & O x 3 HEENT: Atraumatic, Normocephalic. Neck supple. No masses, No LAD. Ears and Nose: No external deformity. CV: RRR, No M/G/R. No JVD. No thrill. No extra heart sounds. PULM: CTA B, no wheezes, crackles, rhonchi. No retractions. No resp. distress. No accessory muscle use. ABD: S, NT, ND, +BS. No rebound. No HSM. EXTR: No c/c/e NEURO Normal gait.  PSYCH: Normally interactive. Conversant. Not depressed or anxious appearing.  Calm demeanor.    Assessment and Plan: ***  Signed Abbe AmsterdamJessica Rolanda Campa, MD

## 2017-04-10 ENCOUNTER — Ambulatory Visit: Payer: Medicare Other | Admitting: Family Medicine

## 2017-04-10 DIAGNOSIS — Z0289 Encounter for other administrative examinations: Secondary | ICD-10-CM

## 2017-04-11 NOTE — Progress Notes (Deleted)
[facility] [place] [address], [address] [place], [place] [address] [phone] [phone]  Date:  04/12/2017   Name:  Domingo CockingJames Maund   DOB:  Jul 15, 1950   MRN:  [id number]  PCP:  Pearline Cablesopland, Jannifer Fischler C, MD    Chief Complaint: No chief complaint on file.   History of Present Illness:  Domingo CockingJames Mullins is a 67 y.o. very pleasant male patient who presents with the following:  History of OSA, HTN, hep c cured Last seen by myself 1/31:  Here today with persistent sx of cough and nasal congestion for several weeks Treat with doxycycline for 10 days- he will let me know if not better soon Encouraged him to take his BP meds on a regular basis to control his HTN He will see me for a CPE soon   Patient Active Problem List   Diagnosis Date Noted  . Benign essential HTN 01/30/2014  . Hepatitis C antibody test positive 01/30/2014  . History of Sleep apnea 01/30/2014    Past Medical History:  Diagnosis Date  . Asthma   . Hypertension     Past Surgical History:  Procedure Laterality Date  . CORRECTION HAMMER TOE      Social History   Tobacco Use  . Smoking status: Former Games developermoker  . Smokeless tobacco: Never Used  . Tobacco comment: 1 cigar a month  Substance Use Topics  . Alcohol use: Yes    Alcohol/week: 1.2 - 2.4 oz    Types: 2 - 4 Cans of beer per week  . Drug use: No    Family History  Problem Relation Age of Onset  . Cancer Mother   . Cancer Father     No Known Allergies  Medication list has been reviewed and updated.  Current Outpatient Medications on File Prior to Visit  Medication Sig Dispense Refill  . amLODipine (NORVASC) 10 MG tablet Take 1 tablet (10 mg total) by mouth daily. 90 tablet 3  . doxycycline (VIBRAMYCIN) 100 MG capsule Take 1 capsule (100 mg total) by mouth 2 (two) times daily. 20 capsule 0  . losartan-hydrochlorothiazide (HYZAAR) 100-12.5 MG tablet Take 1 tablet by mouth daily. 90 tablet 3   No current  facility-administered medications on file prior to visit.     Review of Systems:  As per HPI- otherwise negative.   Physical Examination: There were no vitals filed for this visit. There were no vitals filed for this visit. There is no height or weight on file to calculate BMI. Ideal Body Weight:    GEN: WDWN, NAD, Non-toxic, A & O x 3 HEENT: Atraumatic, Normocephalic. Neck supple. No masses, No LAD. Ears and Nose: No external deformity. CV: RRR, No M/G/R. No JVD. No thrill. No extra heart sounds. PULM: CTA B, no wheezes, crackles, rhonchi. No retractions. No resp. distress. No accessory muscle use. ABD: S, NT, ND, +BS. No rebound. No HSM. EXTR: No c/c/e NEURO Normal gait.  PSYCH: Normally interactive. Conversant. Not depressed or anxious appearing.  Calm demeanor.    Assessment and Plan: ***  Signed Abbe AmsterdamJessica Yared Barefoot, MD

## 2017-04-12 ENCOUNTER — Ambulatory Visit: Payer: Medicare Other | Admitting: Family Medicine

## 2017-04-16 NOTE — Progress Notes (Deleted)
[facility] [address], [address] [place], [place] [address] [phone] [phone]  Date:  04/17/2017   Name:  Jason CockingJames Mullins   DOB:  Mar 04, 1950   MRN:  [id number]  PCP:  Pearline Cablesopland, Jessica C, MD    Chief Complaint: No chief complaint on file.   History of Present Illness:  Jason CockingJames Mullins is a 67 y.o. very pleasant male patient who presents with the following:  Following up today History of HTN, hep C s/p cure with Harvoni I saw him in January for a sick visit Last routine labs about a year ago  Patient Active Problem List   Diagnosis Date Noted  . Benign essential HTN 01/30/2014  . Hepatitis C antibody test positive 01/30/2014  . History of Sleep apnea 01/30/2014    Past Medical History:  Diagnosis Date  . Asthma   . Hypertension     Past Surgical History:  Procedure Laterality Date  . CORRECTION HAMMER TOE      Social History   Tobacco Use  . Smoking status: Former Games developermoker  . Smokeless tobacco: Never Used  . Tobacco comment: 1 cigar a month  Substance Use Topics  . Alcohol use: Yes    Alcohol/week: 1.2 - 2.4 oz    Types: 2 - 4 Cans of beer per week  . Drug use: No    Family History  Problem Relation Age of Onset  . Cancer Mother   . Cancer Father     No Known Allergies  Medication list has been reviewed and updated.  Current Outpatient Medications on File Prior to Visit  Medication Sig Dispense Refill  . amLODipine (NORVASC) 10 MG tablet Take 1 tablet (10 mg total) by mouth daily. 90 tablet 3  . doxycycline (VIBRAMYCIN) 100 MG capsule Take 1 capsule (100 mg total) by mouth 2 (two) times daily. 20 capsule 0  . losartan-hydrochlorothiazide (HYZAAR) 100-12.5 MG tablet Take 1 tablet by mouth daily. 90 tablet 3   No current facility-administered medications on file prior to visit.     Review of Systems:  As per HPI- otherwise negative.   Physical Examination: There were no vitals filed for this  visit. There were no vitals filed for this visit. There is no height or weight on file to calculate BMI. Ideal Body Weight:    GEN: WDWN, NAD, Non-toxic, A & O x 3 HEENT: Atraumatic, Normocephalic. Neck supple. No masses, No LAD. Ears and Nose: No external deformity. CV: RRR, No M/G/R. No JVD. No thrill. No extra heart sounds. PULM: CTA B, no wheezes, crackles, rhonchi. No retractions. No resp. distress. No accessory muscle use. ABD: S, NT, ND, +BS. No rebound. No HSM. EXTR: No c/c/e NEURO Normal gait.  PSYCH: Normally interactive. Conversant. Not depressed or anxious appearing.  Calm demeanor.    Assessment and Plan: ***  Signed Abbe AmsterdamJessica Copland, MD

## 2017-04-17 ENCOUNTER — Ambulatory Visit: Payer: Medicare Other | Admitting: Family Medicine

## 2017-04-23 NOTE — Progress Notes (Addendum)
[facility] [address] [place], [place] [address] [phone]  Date:  04/24/2017   Name:  Jason Mullins   DOB:  Oct 07, 1950   MRN:  [id number]  PCP:  Pearline Cables, MD    Chief Complaint: URI (previous uri, coughing, congestion, took doxycycline--no improvement)   History of Present Illness:  Jason Mullins is a 67 y.o. very pleasant male patient who presents with the following:  History of HTN, OSA At our last visit about 8 weeks ago he has not been taking his BP meds on a regular basis:  Here today with persistent sx of cough and nasal congestion for several weeks Treat with doxycycline for 10 days- he will let me know if not better soon Encouraged him to take his BP meds on a regular basis to control his HTN He will see me for a CPE soon   He reports that he continues to have sx of cough and congestion- the doxycycline did not resolve his sx as we had hoped Never had a fever He is blowing material our of his head and sinuses  He has not had low energy or had to take to his bed at all- does not feel toxic but just can't get rid of these sx  He has not noted any wheezing recently He does have some chest congestion but not as much as his sinus congestion  He has tried mucinex- no other OTC meds No vomiting or diarrhea  His appetite is good  Not sneezing  BP Readings from Last 3 Encounters:  04/24/17 130/72  02/23/17 (!) 160/90  06/21/16 124/84   His BP is good again today - he is taking his meds on a regular basis He is due for annual labs and we will get for him today    History of hep C s/p cure, no history of DM  Patient Active Problem List   Diagnosis Date Noted  . Benign essential HTN 01/30/2014  . Hepatitis C antibody test positive 01/30/2014  . History of Sleep apnea 01/30/2014    Past Medical History:  Diagnosis Date  . Asthma   . Hypertension     Past Surgical History:  Procedure  Laterality Date  . CORRECTION HAMMER TOE      Social History   Tobacco Use  . Smoking status: Former Games developer  . Smokeless tobacco: Never Used  . Tobacco comment: 1 cigar a month  Substance Use Topics  . Alcohol use: Yes    Alcohol/week: 1.2 - 2.4 oz    Types: 2 - 4 Cans of beer per week  . Drug use: No    Family History  Problem Relation Age of Onset  . Cancer Mother   . Cancer Father     No Known Allergies  Medication list has been reviewed and updated.  Current Outpatient Medications on File Prior to Visit  Medication Sig Dispense Refill  . amLODipine (NORVASC) 10 MG tablet Take 1 tablet (10 mg total) by mouth daily. 90 tablet 3  . losartan-hydrochlorothiazide (HYZAAR) 100-12.5 MG tablet Take 1 tablet by mouth daily. 90 tablet 3   No current facility-administered medications on file prior to visit.     Review of Systems:  As per HPI- otherwise negative.   Physical Examination: Vitals:   04/24/17 1446  BP: 130/72  Pulse: 63  Resp: 16  SpO2: 95%   Vitals:   04/24/17 1446  Weight: 218  lb 8 oz (99.1 kg)  Height: 6\' 4"  (1.93 m)   Body mass index is 26.6 kg/m. Ideal Body Weight: Weight in (lb) to have BMI = 25: 205  GEN: WDWN, NAD, Non-toxic, A & O x 3, looks well, normal weight HEENT: Atraumatic, Normocephalic. Neck supple. No masses, No LAD.  Bilateral TM wnl, oropharynx normal.  PEERL,EOMI.   Nasal cavity is somewhat inflamed  Ears and Nose: No external deformity. CV: RRR, No M/G/R. No JVD. No thrill. No extra heart sounds. PULM: CTA B, no wheezes, crackles, rhonchi. No retractions. No resp. distress. No accessory muscle use. EXTR: No c/c/e NEURO Normal gait.  PSYCH: Normally interactive. Conversant. Not depressed or anxious appearing.  Calm demeanor.    Assessment and Plan: Chronic congestion of paranasal sinus - Plan: predniSONE (DELTASONE) 20 MG tablet  Screening for diabetes mellitus - Plan: Comprehensive metabolic panel  Screening for  prostate cancer - Plan: PSA, Medicare  Dyslipidemia - Plan: Lipid panel  Medication monitoring encounter - Plan: CBC, Comprehensive metabolic panel  Benign essential HTN  Here today with persistent sinus sx Will try a short course of prednisone and also suggested an OTC nasal steroid.  He will let me know if not helpful Will plan further follow- up pending labs. BP ok today   Signed Abbe Amsterdam, MD  Received his labs 4/2, letter to pt   Your labs look good overall- blood count is normal, metabolic profile is normal, cholesterol looks good.  Your PSA number is still in normal range, but it has about doubled since last check, which does concern me.  We should repeat this in about one month to see it if may come back down.  I will order this for you as a lab visit only.  No sex for about 1 week prior to repeat test.    Results for orders placed or performed in visit on 04/24/17  CBC  Result Value Ref Range   WBC 4.2 4.0 - 10.5 K/uL   RBC 4.62 4.22 - 5.81 Mil/uL   Platelets 310.0 150.0 - 400.0 K/uL   Hemoglobin 13.9 13.0 - 17.0 g/dL   HCT 40.9 81.1 - 91.4 %   MCV 89.5 78.0 - 100.0 fl   MCHC 33.6 30.0 - 36.0 g/dL   RDW 78.2 95.6 - 21.3 %  Comprehensive metabolic panel  Result Value Ref Range   Sodium 137 135 - 145 mEq/L   Potassium 4.7 3.5 - 5.1 mEq/L   Chloride 102 96 - 112 mEq/L   CO2 30 19 - 32 mEq/L   Glucose, Bld 85 70 - 99 mg/dL   BUN 21 6 - 23 mg/dL   Creatinine, Ser 0.86 0.40 - 1.50 mg/dL   Total Bilirubin 0.4 0.2 - 1.2 mg/dL   Alkaline Phosphatase 79 39 - 117 U/L   AST 16 0 - 37 U/L   ALT 13 0 - 53 U/L   Total Protein 7.5 6.0 - 8.3 g/dL   Albumin 3.9 3.5 - 5.2 g/dL   Calcium 57.8 8.4 - 46.9 mg/dL   GFR 62.95 >28.41 mL/min  Lipid panel  Result Value Ref Range   Cholesterol 159 0 - 200 mg/dL   Triglycerides 32.4 0.0 - 149.0 mg/dL   HDL 40.10 >27.25 mg/dL   VLDL 36.6 0.0 - 44.0 mg/dL   LDL Cholesterol 90 0 - 99 mg/dL   Total CHOL/HDL Ratio 3    NonHDL  107.12   PSA, Medicare  Result Value Ref Range  PSA 1.28 0.10 - 4.00 ng/ml

## 2017-04-24 ENCOUNTER — Encounter: Payer: Self-pay | Admitting: Family Medicine

## 2017-04-24 ENCOUNTER — Ambulatory Visit (INDEPENDENT_AMBULATORY_CARE_PROVIDER_SITE_OTHER): Payer: Medicare Other | Admitting: Family Medicine

## 2017-04-24 VITALS — BP 130/72 | HR 63 | Resp 16 | Ht 76.0 in | Wt 218.5 lb

## 2017-04-24 DIAGNOSIS — Z125 Encounter for screening for malignant neoplasm of prostate: Secondary | ICD-10-CM | POA: Diagnosis not present

## 2017-04-24 DIAGNOSIS — Z131 Encounter for screening for diabetes mellitus: Secondary | ICD-10-CM | POA: Diagnosis not present

## 2017-04-24 DIAGNOSIS — J329 Chronic sinusitis, unspecified: Secondary | ICD-10-CM | POA: Diagnosis not present

## 2017-04-24 DIAGNOSIS — R972 Elevated prostate specific antigen [PSA]: Secondary | ICD-10-CM | POA: Diagnosis not present

## 2017-04-24 DIAGNOSIS — E785 Hyperlipidemia, unspecified: Secondary | ICD-10-CM | POA: Diagnosis not present

## 2017-04-24 DIAGNOSIS — I10 Essential (primary) hypertension: Secondary | ICD-10-CM | POA: Diagnosis not present

## 2017-04-24 DIAGNOSIS — Z5181 Encounter for therapeutic drug level monitoring: Secondary | ICD-10-CM | POA: Diagnosis not present

## 2017-04-24 MED ORDER — PREDNISONE 20 MG PO TABS: ORAL_TABLET | ORAL | 0 refills | Status: DC

## 2017-04-24 NOTE — Patient Instructions (Signed)
Good to see you today- we are going to try a short course of oral prednisone for your nasal symptoms.  Take 2 pills a day for 3 days, then 1 pill a day for 3 days Please also pick up an OTC nasal steroid spray such as flonase and use this for 2-4 weeks  Please let me know if your symptoms are not much better in a week or so- Sooner if worse.

## 2017-04-25 ENCOUNTER — Encounter: Payer: Self-pay | Admitting: Family Medicine

## 2017-04-25 LAB — LIPID PANEL
Cholesterol: 159 mg/dL (ref 0–200)
HDL: 52.2 mg/dL (ref >39.00)
LDL Cholesterol: 90 mg/dL (ref 0–99)
NonHDL: 107.12
Total CHOL/HDL Ratio: 3
Triglycerides: 85 mg/dL (ref 0.0–149.0)
VLDL: 17 mg/dL (ref 0.0–40.0)

## 2017-04-25 LAB — COMPREHENSIVE METABOLIC PANEL
ALT: 13 U/L (ref 0–53)
AST: 16 U/L (ref 0–37)
Albumin: 3.9 g/dL (ref 3.5–5.2)
Alkaline Phosphatase: 79 U/L (ref 39–117)
BUN: 21 mg/dL (ref 6–23)
CO2: 30 mEq/L (ref 19–32)
Calcium: 10.3 mg/dL (ref 8.4–10.5)
Chloride: 102 mEq/L (ref 96–112)
Creatinine, Ser: 1 mg/dL (ref 0.40–1.50)
GFR: 95.65 mL/min (ref >60.00)
Glucose, Bld: 85 mg/dL (ref 70–99)
Potassium: 4.7 mEq/L (ref 3.5–5.1)
Sodium: 137 mEq/L (ref 135–145)
Total Bilirubin: 0.4 mg/dL (ref 0.2–1.2)
Total Protein: 7.5 g/dL (ref 6.0–8.3)

## 2017-04-25 LAB — CBC
HCT: 41.3 % (ref 39.0–52.0)
Hemoglobin: 13.9 g/dL (ref 13.0–17.0)
MCHC: 33.6 g/dL (ref 30.0–36.0)
MCV: 89.5 fl (ref 78.0–100.0)
Platelets: 310 10*3/uL (ref 150.0–400.0)
RBC: 4.62 Mil/uL (ref 4.22–5.81)
RDW: 13.3 % (ref 11.5–15.5)
WBC: 4.2 10*3/uL (ref 4.0–10.5)

## 2017-04-25 LAB — PSA, MEDICARE: PSA: 1.28 ng/ml (ref 0.10–4.00)

## 2017-04-25 NOTE — Addendum Note (Signed)
Addended by: Abbe AmsterdamOPLAND, Jazzlin Clements C on: 04/25/2017 04:19 PM   Modules accepted: Orders

## 2017-05-10 ENCOUNTER — Ambulatory Visit: Payer: Medicare Other | Admitting: *Deleted

## 2017-05-11 DIAGNOSIS — R21 Rash and other nonspecific skin eruption: Secondary | ICD-10-CM | POA: Diagnosis not present

## 2017-05-11 DIAGNOSIS — B356 Tinea cruris: Secondary | ICD-10-CM | POA: Diagnosis not present

## 2017-05-20 ENCOUNTER — Other Ambulatory Visit: Payer: Self-pay | Admitting: Family Medicine

## 2017-05-20 DIAGNOSIS — I10 Essential (primary) hypertension: Secondary | ICD-10-CM

## 2017-06-22 ENCOUNTER — Ambulatory Visit: Payer: Medicare Other | Admitting: *Deleted

## 2017-06-23 NOTE — Progress Notes (Signed)
Subjective:   Jason Mullins is a 67 y.o. male who presents for Medicare Annual/Subsequent preventive examination.  Review of Systems: No ROS.  Medicare Wellness Visit. Additional risk factors are reflected in the social history. Cardiac Risk Factors include: advanced age (>38men, >61 women);hypertension Sleep patterns: Sleeps very well. No issues.  Home Safety/Smoke Alarms: Feels safe in home. Smoke alarms in place.  Living environment; residence and Firearm Safety: Lives with wife in 2 story home.   Male:   CCS-  Pt reports done 2 yrs ago. Will call us back with name of office so I may request report.  PSA-  Lab Results  Component Value Date   PSA 1.28 04/24/2017   PSA 0.61 05/09/2016   PSA 0.63 05/06/2015                  Objective:    Vitals: BP 140/90 (BP Location: Left Arm, Patient Position: Sitting, Cuff Size: Normal)   Pulse (!) 51   Ht  (1.803 m)   Wt 217 lb 9.6 oz (98.7 kg)   SpO2 98%   BMI 30.35 kg/m   Body mass index is 30.35 kg/m.  Advanced Directives 06/30/2017 06/21/2016  Does Patient Have a Medical Advance Directive? Yes Yes  Type of Estate agent of [place];Living will Healthcare Power of [place];Living will  Copy of Healthcare Power of Attorney in Chart? No - copy requested No - copy requested    Tobacco Social History   Tobacco Use  Smoking Status Former Smoker  Smokeless Tobacco Never Used  Tobacco Comment   1 cigar a month     Counseling given: Not Answered Comment: 1 cigar a month   Clinical Intake: Pain : No/denies pain    Past Medical History:  Diagnosis Date  . Asthma   . Hypertension    Past Surgical History:  Procedure Laterality Date  . CORRECTION HAMMER TOE     Family History  Problem Relation Age of Onset  . Cancer Mother   . Cancer Father    Social History   Socioeconomic History  . Marital status: Married    Spouse name: Jason Mullins  . Number of children: Not on file  . Years of  education: Not on file  . Highest education level: Not on file  Occupational History  . Not on file  Social Needs  . Financial resource strain: Not on file  . Food insecurity:    Worry: Not on file    Inability: Not on file  . Transportation needs:    Medical: Not on file    Non-medical: Not on file  Tobacco Use  . Smoking status: Former Games developer  . Smokeless tobacco: Never Used  . Tobacco comment: 1 cigar a month  Substance and Sexual Activity  . Alcohol use: Yes    Alcohol/week: 1.2 - 2.4 oz    Types: 2 - 4 Cans of beer per week  . Drug use: No  . Sexual activity: Yes  Lifestyle  . Physical activity:    Days per week: Not on file    Minutes per session: Not on file  . Stress: Not on file  Relationships  . Social connections:    Talks on phone: Not on file    Gets together: Not on file    Attends religious service: Not on file    Active member of club or organization: Not on file    Attends meetings of clubs or organizations: Not on file  Relationship status: Not on file  Other Topics Concern  . Not on file  Social History Narrative  . Not on file    Outpatient Encounter Medications as of 06/30/2017  Medication Sig  . amLODipine (NORVASC) 10 MG tablet TAKE 1 TABLET (10 MG TOTAL) BY MOUTH DAILY.  Marland Kitchen losartan-hydrochlorothiazide (HYZAAR) 100-12.5 MG tablet TAKE 1 TABLET BY MOUTH DAILY.  . [DISCONTINUED] predniSONE (DELTASONE) 20 MG tablet Take 2 pills a day for 3 days, then 1 pill a day for 3 days   No facility-administered encounter medications on file as of 06/30/2017.     Activities of Daily Living In your present state of health, do you have any difficulty performing the following activities: 06/30/2017  Hearing? N  Vision? N  Difficulty concentrating or making decisions? N  Walking or climbing stairs? N  Dressing or bathing? N  Doing errands, shopping? N  Preparing Food and eating ? N  Using the Toilet? N  In the past six months, have you accidently leaked  urine? N  Do you have problems with loss of bowel control? N  Managing your Medications? N  Managing your Finances? N  Housekeeping or managing your Housekeeping? N  Some recent data might be hidden    Patient Care Team: Copland, Gwenlyn Found, MD as PCP - General (Family Medicine) Teryl Lucy, MD as Consulting Physician (Orthopedic Surgery) Manuela Schwartz, DDS as Consulting Physician (Dentistry)   Assessment:   This is a routine wellness examination for Jason Mullins. Physical assessment deferred to PCP.  Exercise Activities and Dietary recommendations Current Exercise Habits: Structured exercise class, Type of exercise: yoga;treadmill, Time (Minutes): 60, Frequency (Times/Week): 3, Weekly Exercise (Minutes/Week): 180, Intensity: Mild, Exercise limited by: None identified Diet (meal preparation, eat out, water intake, caffeinated beverages, dairy products, fruits and vegetables): in general, a "healthy" diet  , well balanced     Goals    . Maintain current healthy lifestyle.        Fall Risk Fall Risk  06/30/2017 06/21/2016 05/13/2015 11/05/2014 05/01/2014  Falls in the past year? No No No No No    Depression Screen PHQ 2/9 Scores 06/30/2017 06/21/2016 05/13/2015 11/05/2014  PHQ - 2 Score 0 0 0 0  Exception Documentation - - Patient refusal -    Cognitive Function Ad8 score reviewed for issues:  Issues making decisions:no  Less interest in hobbies / activities:no  Repeats questions, stories (family complaining):no  Trouble using ordinary gadgets (microwave, computer, phone):no  Forgets the month or year: no  Mismanaging finances: no  Remembering appts:no  Daily problems with thinking and/or memory:no Ad8 score is=0   MMSE - Mini Mental State Exam 06/21/2016  Orientation to time 4  Orientation to Place 5  Registration 3  Attention/ Calculation 5  Recall 3  Language- name 2 objects 2  Language- repeat 1  Language- follow 3 step command 3  Language- read & follow direction  1  Write a sentence 1  Copy design 1  Total score 29        Immunization History  Administered Date(s) Administered  . Hepatitis A, Adult 11/05/2014  . Hepatitis B, adult 11/05/2014  . Pneumococcal Conjugate-13 05/06/2015  . Tdap 01/24/2010   Screening Tests Health Maintenance  Topic Date Due  . INFLUENZA VACCINE  10/26/2017 (Originally 08/24/2017)  . PNA vac Low Risk Adult (2 of 2 - PPSV23) 05/09/2021 (Originally 05/05/2016)  . TETANUS/TDAP  01/25/2020  . COLONOSCOPY  01/25/2023  . Hepatitis C Screening  Completed  Plan:    Please schedule your next medicare wellness visit with me in 1 yr.  Continue to eat heart healthy diet (full of fruits, vegetables, whole grains, lean protein, water--limit salt, fat, and sugar intake) and increase physical activity as tolerated.  Continue doing brain stimulating activities (puzzles, reading, adult coloring books, staying active) to keep memory sharp.   Please notify our office where you had last colonoscopy.  Bring a copy of your living will and/or healthcare power of attorney to your next office visit.    I have personally reviewed and noted the following in the patient's chart:   . Medical and social history . Use of alcohol, tobacco or illicit drugs  . Current medications and supplements . Functional ability and status . Nutritional status . Physical activity . Advanced directives . List of other physicians . Hospitalizations, surgeries, and ER visits in previous 12 months . Vitals . Screenings to include cognitive, depression, and falls . Referrals and appointments  In addition, I have reviewed and discussed with patient certain preventive protocols, quality metrics, and best practice recommendations. A written personalized care plan for preventive services as well as general preventive health recommendations were provided to patient.     [place]on Gully, [place]  06/30/2017

## 2017-06-30 ENCOUNTER — Ambulatory Visit (INDEPENDENT_AMBULATORY_CARE_PROVIDER_SITE_OTHER): Payer: Medicare Other | Admitting: *Deleted

## 2017-06-30 ENCOUNTER — Encounter: Payer: Self-pay | Admitting: *Deleted

## 2017-06-30 VITALS — BP 140/90 | HR 51 | Ht 76.0 in | Wt 217.6 lb

## 2017-06-30 DIAGNOSIS — Z0000 Encounter for general adult medical examination without abnormal findings: Secondary | ICD-10-CM

## 2017-06-30 NOTE — Patient Instructions (Signed)
Please schedule your next medicare wellness visit with me in 1 yr.  Continue to eat heart healthy diet (full of fruits, vegetables, whole grains, lean protein, water--limit salt, fat, and sugar intake) and increase physical activity as tolerated.  Continue doing brain stimulating activities (puzzles, reading, adult coloring books, staying active) to keep memory sharp.   Please notify our office where you had last colonoscopy.  Bring a copy of your living will and/or healthcare power of attorney to your next office visit.   Jason Mullins , Thank you for taking time to come for your Medicare Wellness Visit. I appreciate your ongoing commitment to your health goals. Please review the following plan we discussed and let me know if I can assist you in the future.   These are the goals we discussed: Goals    . Maintain current healthy lifestyle.        This is a list of the screening recommended for you and due dates:  Health Maintenance  Topic Date Due  . Flu Shot  10/26/2017*  . Pneumonia vaccines (2 of 2 - PPSV23) 05/09/2021*  . Tetanus Vaccine  01/25/2020  . Colon Cancer Screening  01/25/2023  .  Hepatitis C: One time screening is recommended by Center for Disease Control  (CDC) for  adults born from [birth year] through 1965.   Completed  *Topic was postponed. The date shown is not the original due date.    Health Maintenance, Male A healthy lifestyle and preventive care is important for your health and wellness. Ask your health care provider about what schedule of regular examinations is right for you. What should I know about weight and diet? Eat a Healthy Diet  Eat plenty of vegetables, fruits, whole grains, low-fat dairy products, and lean protein.  Do not eat a lot of foods high in solid fats, added sugars, or salt.  Maintain a Healthy Weight Regular exercise can help you achieve or maintain a healthy weight. You should:  Do at least 150 minutes of exercise each week. The  exercise should increase your heart rate and make you sweat (moderate-intensity exercise).  Do strength-training exercises at least twice a week.  Watch Your Levels of Cholesterol and Blood Lipids  Have your blood tested for lipids and cholesterol every 5 years starting at 68 years of age. If you are at high risk for heart disease, you should start having your blood tested when you are 68 years old. You may need to have your cholesterol levels checked more often if: ? Your lipid or cholesterol levels are high. ? You are older than 68 years of age. ? You are at high risk for heart disease.  What should I know about cancer screening? Many types of cancers can be detected early and may often be prevented. Lung Cancer  You should be screened every year for lung cancer if: ? You are a current smoker who has smoked for at least 30 years. ? You are a former smoker who has quit within the past 15 years.  Talk to your health care provider about your screening options, when you should start screening, and how often you should be screened.  Colorectal Cancer  Routine colorectal cancer screening usually begins at 68 years of age and should be repeated every 5-10 years until you are 68 years old. You may need to be screened more often if early forms of precancerous polyps or small growths are found. Your health care provider may recommend screening at an earlier  age if you have risk factors for colon cancer.  Your health care provider may recommend using home test kits to check for hidden blood in the stool.  A small camera at the end of a tube can be used to examine your colon (sigmoidoscopy or colonoscopy). This checks for the earliest forms of colorectal cancer.  Prostate and Testicular Cancer  Depending on your age and overall health, your health care provider may do certain tests to screen for prostate and testicular cancer.  Talk to your health care provider about any symptoms or concerns  you have about testicular or prostate cancer.  Skin Cancer  Check your skin from head to toe regularly.  Tell your health care provider about any new moles or changes in moles, especially if: ? There is a change in a mole's size, shape, or color. ? You have a mole that is larger than a pencil eraser.  Always use sunscreen. Apply sunscreen liberally and repeat throughout the day.  Protect yourself by wearing long sleeves, pants, a wide-brimmed hat, and sunglasses when outside.  What should I know about heart disease, diabetes, and high blood pressure?  If you are 54-82 years of age, have your blood pressure checked every 3-5 years. If you are 62 years of age or older, have your blood pressure checked every year. You should have your blood pressure measured twice-once when you are at a hospital or clinic, and once when you are not at a hospital or clinic. Record the average of the two measurements. To check your blood pressure when you are not at a hospital or clinic, you can use: ? An automated blood pressure machine at a pharmacy. ? A home blood pressure monitor.  Talk to your health care provider about your target blood pressure.  If you are between 76-70 years old, ask your health care provider if you should take aspirin to prevent heart disease.  Have regular diabetes screenings by checking your fasting blood sugar level. ? If you are at a normal weight and have a low risk for diabetes, have this test once every three years after the age of 87. ? If you are overweight and have a high risk for diabetes, consider being tested at a younger age or more often.  A one-time screening for abdominal aortic aneurysm (AAA) by ultrasound is recommended for men aged 65-75 years who are current or former smokers. What should I know about preventing infection? Hepatitis B If you have a higher risk for hepatitis B, you should be screened for this virus. Talk with your health care provider to find  out if you are at risk for hepatitis B infection. Hepatitis C Blood testing is recommended for:  Everyone born from [birth year] through 1966.  Anyone with known risk factors for hepatitis C.  Sexually Transmitted Diseases (STDs)  You should be screened each year for STDs including gonorrhea and chlamydia if: ? You are sexually active and are younger than 68 years of age. ? You are older than 68 years of age and your health care provider tells you that you are at risk for this type of infection. ? Your sexual activity has changed since you were last screened and you are at an increased risk for chlamydia or gonorrhea. Ask your health care provider if you are at risk.  Talk with your health care provider about whether you are at high risk of being infected with HIV. Your health care provider may recommend a prescription medicine to  help prevent HIV infection.  What else can I do?  Schedule regular health, dental, and eye exams.  Stay current with your vaccines (immunizations).  Do not use any tobacco products, such as cigarettes, chewing tobacco, and e-cigarettes. If you need help quitting, ask your health care provider.  Limit alcohol intake to no more than 2 drinks per day. One drink equals 12 ounces of beer, 5 ounces of wine, or 1 ounces of hard liquor.  Do not use street drugs.  Do not share needles.  Ask your health care provider for help if you need support or information about quitting drugs.  Tell your health care provider if you often feel depressed.  Tell your health care provider if you have ever been abused or do not feel safe at home. This information is not intended to replace advice given to you by your health care provider. Make sure you discuss any questions you have with your health care provider. Document Released: 07/09/2007 Document Revised: 09/09/2015 Document Reviewed: 10/14/2014 Elsevier Interactive Patient Education  [employer].

## 2017-06-30 NOTE — Progress Notes (Signed)
Noted. Agree with above.  Jilda Rocheicholas Paul WindberWendling, DO 06/30/17 1:07 PM

## 2017-08-09 ENCOUNTER — Telehealth: Payer: Self-pay

## 2017-08-09 NOTE — Telephone Encounter (Signed)
Copied from CRM [id number]. Topic: General - Other >> Aug 09, 2017 11:46 AM Elliot GaultBell, Tiffany M wrote: Relation to pt: self  Call back number: [phone]   Reason for call:   Patient received a no show bill but unsure about the date of service, patient was informed in the future please all 24 hour in advance, patient states it was emergency and had to go out of town therefore he had to call the same day and cancel. Patient requesting PCP waive no show fee for  04/10/17, 04/12/17, 04/17/17. Patient was seen on 04/24/17, patient would like a follow up call regarding the outcome, please advise

## 2017-08-10 NOTE — Telephone Encounter (Signed)
Emailed Psychologist, occupationalcharge correction for fee to be removed as one time courtesy.

## 2017-08-23 ENCOUNTER — Other Ambulatory Visit: Payer: Self-pay

## 2017-11-24 DIAGNOSIS — H2513 Age-related nuclear cataract, bilateral: Secondary | ICD-10-CM | POA: Diagnosis not present

## 2017-11-24 DIAGNOSIS — H04123 Dry eye syndrome of bilateral lacrimal glands: Secondary | ICD-10-CM | POA: Diagnosis not present

## 2017-12-11 ENCOUNTER — Telehealth: Payer: Self-pay | Admitting: Family Medicine

## 2017-12-11 DIAGNOSIS — I10 Essential (primary) hypertension: Secondary | ICD-10-CM

## 2017-12-11 MED ORDER — LOSARTAN POTASSIUM 100 MG PO TABS: 100.0000 mg | ORAL_TABLET | Freq: Every day | ORAL | 3 refills | Status: DC

## 2017-12-11 MED ORDER — HYDROCHLOROTHIAZIDE 12.5 MG PO CAPS: 12.5000 mg | ORAL_CAPSULE | Freq: Every day | ORAL | 3 refills | Status: DC

## 2017-12-11 NOTE — Telephone Encounter (Signed)
Copied from CRM [phone]. Topic: Quick Communication - Rx Refill/Question >> Dec 11, 2017 12:11 PM Stephannie LiSimmons, Bertice Risse L, NT wrote: Medication:  losartan-hydrochlorothiazide (HYZAAR) 100-12.5 MG tablet  Has the patient contacted their pharmacy? yes  (Agent: If no, request that the patient contact the pharmacy for the refill.  (Agent: If yes, when and what did the pharmacy advise? Pharmacy told the patient the above medication is on back order and they would like for the pcp to prescribe another medication due to them not knowing if or when it would come in .  Preferred Pharmacy (with phone number or street name [id number] - [place], [place] - [address] AT [address]OSS [address] [phone] (Phone) [phone] (Fax)    Agent: Please be advised that RX refills may take up to 3 business days. We ask that you follow-up with your pharmacy.

## 2017-12-11 NOTE — Telephone Encounter (Signed)
Will splint into separate losartan and hctz for now- called pt and let him know

## 2018-02-26 ENCOUNTER — Ambulatory Visit (HOSPITAL_BASED_OUTPATIENT_CLINIC_OR_DEPARTMENT_OTHER)
Admission: RE | Admit: 2018-02-26 | Discharge: 2018-02-26 | Disposition: A | Discharge status: Home / Self Care | Payer: Medicare Other | Source: Ambulatory Visit | Attending: Family Medicine | Admitting: Family Medicine

## 2018-02-26 ENCOUNTER — Encounter: Payer: Self-pay | Admitting: Family Medicine

## 2018-02-26 ENCOUNTER — Ambulatory Visit (INDEPENDENT_AMBULATORY_CARE_PROVIDER_SITE_OTHER): Payer: Medicare Other | Admitting: Family Medicine

## 2018-02-26 VITALS — BP 128/82 | HR 67 | Temp 99.6°F | Resp 16 | Ht 76.0 in | Wt 215.0 lb

## 2018-02-26 DIAGNOSIS — J0110 Acute frontal sinusitis, unspecified: Secondary | ICD-10-CM | POA: Diagnosis not present

## 2018-02-26 DIAGNOSIS — R059 Cough, unspecified: Secondary | ICD-10-CM

## 2018-02-26 DIAGNOSIS — R509 Fever, unspecified: Secondary | ICD-10-CM

## 2018-02-26 DIAGNOSIS — J029 Acute pharyngitis, unspecified: Secondary | ICD-10-CM | POA: Diagnosis not present

## 2018-02-26 DIAGNOSIS — R05 Cough: Secondary | ICD-10-CM

## 2018-02-26 DIAGNOSIS — R062 Wheezing: Secondary | ICD-10-CM

## 2018-02-26 LAB — POC INFLUENZA A&B (BINAX/QUICKVUE)
Influenza A, POC: NEGATIVE
Influenza B, POC: NEGATIVE

## 2018-02-26 LAB — POCT RAPID STREP A (OFFICE): Rapid Strep A Screen: NEGATIVE

## 2018-02-26 MED ORDER — DOXYCYCLINE HYCLATE 100 MG PO TABS: 100.0000 mg | ORAL_TABLET | Freq: Two times a day (BID) | ORAL | 0 refills | Status: DC

## 2018-02-26 MED ORDER — ALBUTEROL SULFATE HFA 108 (90 BASE) MCG/ACT IN AERS
2.0000 | INHALATION_SPRAY | Freq: Four times a day (QID) | RESPIRATORY_TRACT | 2 refills | Status: DC | PRN
Start: 2018-02-26 — End: 2018-03-22

## 2018-02-26 NOTE — Patient Instructions (Addendum)
Please go for your chest film, then you can head home.  I will be in touch with your results later today If you have any infection we will treat with antibiotics  I went ahead and called in an inhaler for you to use as needed for wheezing
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
## 2018-03-21 ENCOUNTER — Encounter: Payer: Self-pay | Admitting: Family Medicine

## 2018-03-21 NOTE — Progress Notes (Signed)
[facility] [address] [place], [place] [address] [phone]  Date:  03/22/2018   Name:  Jason Mullins   DOB:  [birth year]   MRN:  [id number]  PCP:  Pearline Cables, MD    Chief Complaint: Smell in Nose (constant smell "like medicine" in nose, no headache no other symptoms, possible side effects?) and Flu Vaccine (declines flu shot)   History of Present Illness:  Jason Mullins is a 68 y.o. very pleasant male patient who presents with the following:  Gentleman with history of hypertension and sleep apnea.  History of hepatitis C, cured with Harvoni I last saw him in the beginning of this month with illness, we treated him with doxycycline for bronchitis/sinusitis Here today with concern of a strange smell that he has noted for about 2 weeks It seems to be always there He is otherwise feeling well No headaches He cannot really describe the smell- maybe "smells like medicine" He can smell other smells normally Taste seems to be ok  It is not bad- does not smell like infection or rotting   Flu shot- declines    Patient Active Problem List   Diagnosis Date Noted  . Benign essential HTN 01/30/2014  . Hepatitis C antibody test positive 01/30/2014  . History of Sleep apnea 01/30/2014    Past Medical History:  Diagnosis Date  . Asthma   . History of hepatitis C    Cured  . Hypertension     Past Surgical History:  Procedure Laterality Date  . CORRECTION HAMMER TOE      Social History   Tobacco Use  . Smoking status: Former Games developer  . Smokeless tobacco: Never Used  . Tobacco comment: 1 cigar a month  Substance Use Topics  . Alcohol use: Yes    Alcohol/week: 2.0 - 4.0 standard drinks    Types: 2 - 4 Cans of beer per week  . Drug use: No    Family History  Problem Relation Age of Onset  . Cancer Mother   . Cancer Father     No Known Allergies  Medication list has been reviewed and  updated.  Current Outpatient Medications on File Prior to Visit  Medication Sig Dispense Refill  . amLODipine (NORVASC) 10 MG tablet TAKE 1 TABLET (10 MG TOTAL) BY MOUTH DAILY. 90 tablet 3  . hydrochlorothiazide (MICROZIDE) 12.5 MG capsule Take 1 capsule (12.5 mg total) by mouth daily. 90 capsule 3  . losartan (COZAAR) 100 MG tablet Take 1 tablet (100 mg total) by mouth daily. 90 tablet 3   No current facility-administered medications on file prior to visit.     Review of Systems:  As per HPI- otherwise negative. Taste seems normal He can otherwise smell things, just has this smell in the background    Physical Examination: Vitals:   03/22/18 1418  BP: 126/86  Pulse: 95  Resp: 16  Temp: 97.8 F (36.6 C)  SpO2: 99%   Vitals:   03/22/18 1418  Weight: 217 lb (98.4 kg)  Height: 6\' 4"  (1.93 m)   Body mass index is 26.41 kg/m. Ideal Body Weight: Weight in (lb) to have BMI = 25: 205  GEN: WDWN, NAD, Non-toxic, A & O x 3, tall build, looks well  HEENT: Atraumatic, Normocephalic. Neck supple. No masses, No LAD.  Bilateral TM wnl, oropharynx normal.  PEERL,EOMI.   Nasal cavity with some erythema Ears and Nose: No external  deformity. CV: RRR, No M/G/R. No JVD. No thrill. No extra heart sounds. PULM: CTA B, no wheezes, crackles, rhonchi. No retractions. No resp. distress. No accessory muscle use. EXTR: No c/c/e NEURO Normal gait.  PSYCH: Normally interactive. Conversant. Not depressed or anxious appearing.  Calm demeanor.    Assessment and Plan: Smell disorder - Plan: Ambulatory referral to ENT  Here today with concern of an odd smell for about 2 weeks He is otherwise feeling well He is not sure if this is anything to worry about- decided to put in an ENT referral If sx resolves he can always cancel appt  He will let me know if any other concerns   Signed Abbe Amsterdam, MD

## 2018-03-22 ENCOUNTER — Encounter: Payer: Self-pay | Admitting: Family Medicine

## 2018-03-22 ENCOUNTER — Ambulatory Visit (INDEPENDENT_AMBULATORY_CARE_PROVIDER_SITE_OTHER): Payer: Medicare Other | Admitting: Family Medicine

## 2018-03-22 VITALS — BP 126/86 | HR 95 | Temp 97.8°F | Resp 16 | Ht 76.0 in | Wt 217.0 lb

## 2018-03-22 DIAGNOSIS — R439 Unspecified disturbances of smell and taste: Secondary | ICD-10-CM

## 2018-03-22 NOTE — Patient Instructions (Signed)
It was good to see you today- I will set you up with ENT to evaluate your smell disorder If you have any other change or worsening in your symptoms please do let me know   You can have the shingles vaccine given at your pharmacy at your convenience

## 2018-06-22 ENCOUNTER — Other Ambulatory Visit: Payer: Self-pay | Admitting: Family Medicine

## 2018-06-22 DIAGNOSIS — I10 Essential (primary) hypertension: Secondary | ICD-10-CM

## 2018-07-04 ENCOUNTER — Ambulatory Visit: Payer: Medicare Other | Admitting: *Deleted

## 2018-07-04 NOTE — Progress Notes (Signed)
Virtual Visit via Video Note  I connected with patient on 07/05/18 at  9:00 AM EDT by audio enabled telemedicine application and verified that I am speaking with the correct person using two identifiers.   THIS ENCOUNTER IS A VIRTUAL VISIT DUE TO COVID-19 - PATIENT WAS NOT SEEN IN THE OFFICE. PATIENT HAS CONSENTED TO VIRTUAL VISIT / TELEMEDICINE VISIT   Location of patient: home  Location of provider: office  I discussed the limitations of evaluation and management by telemedicine and the availability of in person appointments. The patient expressed understanding and agreed to proceed.   Subjective:   Jason Mullins is a 68 y.o. male who presents for Medicare Annual/Subsequent preventive examination.  Enjoys playing golf and reading history.  Review of Systems: No ROS.  Medicare Wellness Virtual Visit.  Visual/audio telehealth visit, UTA vital signs.   See social history for additional risk factors.   Sleep patterns: no issues Home Safety/Smoke Alarms: Feels safe in home. Smoke alarms in place.  Lives with wife in 2 story home. Eye: yearly. Utd per pt.  Male:   CCS-  Pt reports done 3 yrs ago. Dr.Hurrelbrink [facility] PSA-  Lab Results  Component Value Date   PSA 1.28 04/24/2017   PSA 0.61 05/09/2016   PSA 0.63 05/06/2015       Objective:     Advanced Directives 07/05/2018 06/30/2017 06/21/2016  Does Patient Have a Medical Advance Directive? Yes Yes Yes  Type of Estate agentAdvance Directive Healthcare Power of [place];Living will Healthcare Power of [place];Living will Healthcare Power of [place];Living will  Does patient want to make changes to medical advance directive? No - Patient declined - -  Copy of Healthcare Power of Attorney in Chart? Yes - validated most recent copy scanned in chart (See row information) No - copy requested No - copy requested    Tobacco Social History   Tobacco Use  Smoking Status Former Smoker  Smokeless Tobacco Never Used  Tobacco Comment    1 cigar a month     Counseling given: Not Answered Comment: 1 cigar a month   Clinical Intake:     Pain : No/denies pain      Past Medical History:  Diagnosis Date  . Asthma   . History of hepatitis C    Cured  . Hypertension    Past Surgical History:  Procedure Laterality Date  . CORRECTION HAMMER TOE     Family History  Problem Relation Age of Onset  . Cancer Mother   . Cancer Father    Social History   Socioeconomic History  . Marital status: Married    Spouse name: Jason Mullins  . Number of children: Not on file  . Years of education: Not on file  . Highest education level: Not on file  Occupational History  . Not on file  Social Needs  . Financial resource strain: Not on file  . Food insecurity    Worry: Not on file    Inability: Not on file  . Transportation needs    Medical: Not on file    Non-medical: Not on file  Tobacco Use  . Smoking status: Former Games developermoker  . Smokeless tobacco: Never Used  . Tobacco comment: 1 cigar a month  Substance and Sexual Activity  . Alcohol use: Yes    Alcohol/week: 2.0 - 4.0 standard drinks    Types: 2 - 4 Cans of beer per week  . Drug use: No  . Sexual activity: Yes  Lifestyle  .  Physical activity    Days per week: Not on file    Minutes per session: Not on file  . Stress: Not on file  Relationships  . Social Musicianconnections    Talks on phone: Not on file    Gets together: Not on file    Attends religious service: Not on file    Active member of club or organization: Not on file    Attends meetings of clubs or organizations: Not on file    Relationship status: Not on file  Other Topics Concern  . Not on file  Social History Narrative  . Not on file    Outpatient Encounter Medications as of 07/05/2018  Medication Sig  . amLODipine (NORVASC) 10 MG tablet TAKE 1 TABLET (10 MG TOTAL) BY MOUTH DAILY.  . hydrochlorothiazide (MICROZIDE) 12.5 MG capsule Take 1 capsule (12.5 mg total) by mouth daily.  Marland Kitchen. losartan  (COZAAR) 100 MG tablet Take 1 tablet (100 mg total) by mouth daily.   No facility-administered encounter medications on file as of 07/05/2018.     Activities of Daily Living In your present state of health, do you have any difficulty performing the following activities: 07/05/2018  Hearing? N  Vision? N  Difficulty concentrating or making decisions? N  Walking or climbing stairs? N  Dressing or bathing? N  Doing errands, shopping? N  Preparing Food and eating ? N  Using the Toilet? N  In the past six months, have you accidently leaked urine? N  Do you have problems with loss of bowel control? N  Managing your Medications? N  Managing your Finances? N  Housekeeping or managing your Housekeeping? N  Some recent data might be hidden    Patient Care Team: Copland, Gwenlyn FoundJessica C, MD as PCP - General (Family Medicine) Teryl LucyLandau, Joshua, MD as Consulting Physician (Orthopedic Surgery) Manuela SchwartzJeffrey Braddy, DDS as Consulting Physician (Dentistry)   Assessment:   This is a routine wellness examination for Jason Mullins. Physical assessment deferred to PCP.  Exercise Activities and Dietary recommendations Current Exercise Habits: The patient does not participate in regular exercise at present(plays golf at least once per week), Exercise limited by: None identified Diet (meal preparation, eat out, water intake, caffeinated beverages, dairy products, fruits and vegetables): in general, a "healthy" diet  , well balanced, on average, 3 meals per day     Goals    . Decrease shoulder pain    . Maintain current healthy lifestyle.        Fall Risk Fall Risk  07/05/2018 08/23/2017 06/30/2017 06/21/2016 05/13/2015  Falls in the past year? 0 No No No No  Comment - Emmi Telephone Survey: data to providers prior to load - - -    Depression Screen PHQ 2/9 Scores 07/05/2018 06/30/2017 06/21/2016 05/13/2015  PHQ - 2 Score 0 0 0 0  Exception Documentation - - - Patient refusal    Cognitive Function Ad8 score reviewed for  issues:  Issues making decisions:no  Less interest in hobbies / activities:no  Repeats questions, stories (family complaining):no  Trouble using ordinary gadgets (microwave, computer, phone):no  Forgets the month or year: no  Mismanaging finances: no  Remembering appts:no  Daily problems with thinking and/or memory:no Ad8 score is=0   MMSE - Mini Mental State Exam 06/21/2016  Orientation to time 4  Orientation to Place 5  Registration 3  Attention/ Calculation 5  Recall 3  Language- name 2 objects 2  Language- repeat 1  Language- follow 3 step command 3  Language-  read & follow direction 1  Write a sentence 1  Copy design 1  Total score 29        Immunization History  Administered Date(s) Administered  . Hepatitis A, Adult 11/05/2014  . Hepatitis B, adult 11/05/2014  . Pneumococcal Conjugate-13 05/06/2015  . Tdap 01/24/2010     Screening Tests Health Maintenance  Topic Date Due  . PNA vac Low Risk Adult (2 of 2 - PPSV23) 05/09/2021 (Originally 05/05/2016)  . INFLUENZA VACCINE  08/25/2018  . TETANUS/TDAP  01/25/2020  . COLONOSCOPY  01/25/2023  . Hepatitis C Screening  Completed       Plan:   See you next year!  Continue to eat heart healthy diet (full of fruits, vegetables, whole grains, lean protein, water--limit salt, fat, and sugar intake) and increase physical activity as tolerated.  Continue doing brain stimulating activities (puzzles, reading, adult coloring books, staying active) to keep memory sharp.   Bring a copy of your living will and/or healthcare power of attorney to your next office visit.   I have personally reviewed and noted the following in the patient's chart:   . Medical and social history . Use of alcohol, tobacco or illicit drugs  . Current medications and supplements . Functional ability and status . Nutritional status . Physical activity . Advanced directives . List of other physicians . Hospitalizations, surgeries, and  ER visits in previous 12 months . Vitals . Screenings to include cognitive, depression, and falls . Referrals and appointments  In addition, I have reviewed and discussed with patient certain preventive protocols, quality metrics, and best practice recommendations. A written personalized care plan for preventive services as well as general preventive health recommendations were provided to patient.     Shela Nevin, [place]  07/05/2018

## 2018-07-05 ENCOUNTER — Other Ambulatory Visit: Payer: Self-pay

## 2018-07-05 ENCOUNTER — Ambulatory Visit (INDEPENDENT_AMBULATORY_CARE_PROVIDER_SITE_OTHER): Payer: Medicare Other | Admitting: *Deleted

## 2018-07-05 ENCOUNTER — Encounter: Payer: Self-pay | Admitting: *Deleted

## 2018-07-05 DIAGNOSIS — Z0000 Encounter for general adult medical examination without abnormal findings: Secondary | ICD-10-CM

## 2018-07-05 NOTE — Patient Instructions (Signed)
See you next year!  Continue to eat heart healthy diet (full of fruits, vegetables, whole grains, lean protein, water--limit salt, fat, and sugar intake) and increase physical activity as tolerated.  Continue doing brain stimulating activities (puzzles, reading, adult coloring books, staying active) to keep memory sharp.   Bring a copy of your living will and/or healthcare power of attorney to your next office visit.   Jason Mullins , Thank you for taking time to come for your Medicare Wellness Visit. I appreciate your ongoing commitment to your health goals. Please review the following plan we discussed and let me know if I can assist you in the future.   These are the goals we discussed: Goals    . Decrease shoulder pain    . Maintain current healthy lifestyle.        This is a list of the screening recommended for you and due dates:  Health Maintenance  Topic Date Due  . Pneumonia vaccines (2 of 2 - PPSV23) 05/09/2021*  . Flu Shot  08/25/2018  . Tetanus Vaccine  01/25/2020  . Colon Cancer Screening  01/25/2023  .  Hepatitis C: One time screening is recommended by Center for Disease Control  (CDC) for  adults born from [birth year] through 1966.   Completed  *Topic was postponed. The date shown is not the original due date.    Health Maintenance After Age 2 After age 55, you are at a higher risk for certain long-term diseases and infections as well as injuries from falls. Falls are a major cause of broken bones and head injuries in people who are older than age 20. Getting regular preventive care can help to keep you healthy and well. Preventive care includes getting regular testing and making lifestyle changes as recommended by your health care provider. Talk with your health care provider about:  Which screenings and tests you should have. A screening is a test that checks for a disease when you have no symptoms.  A diet and exercise plan that is right for you. What should I know about  screenings and tests to prevent falls? Screening and testing are the best ways to find a health problem early. Early diagnosis and treatment give you the best chance of managing medical conditions that are common after age 61. Certain conditions and lifestyle choices may make you more likely to have a fall. Your health care provider may recommend:  Regular vision checks. Poor vision and conditions such as cataracts can make you more likely to have a fall. If you wear glasses, make sure to get your prescription updated if your vision changes.  Medicine review. Work with your health care provider to regularly review all of the medicines you are taking, including over-the-counter medicines. Ask your health care provider about any side effects that may make you more likely to have a fall. Tell your health care provider if any medicines that you take make you feel dizzy or sleepy.  Osteoporosis screening. Osteoporosis is a condition that causes the bones to get weaker. This can make the bones weak and cause them to break more easily.  Blood pressure screening. Blood pressure changes and medicines to control blood pressure can make you feel dizzy.  Strength and balance checks. Your health care provider may recommend certain tests to check your strength and balance while standing, walking, or changing positions.  Foot health exam. Foot pain and numbness, as well as not wearing proper footwear, can make you more likely to have  a fall.  Depression screening. You may be more likely to have a fall if you have a fear of falling, feel emotionally low, or feel unable to do activities that you used to do.  Alcohol use screening. Using too much alcohol can affect your balance and may make you more likely to have a fall. What actions can I take to lower my risk of falls? General instructions  Talk with your health care provider about your risks for falling. Tell your health care provider if: ? You fall. Be sure  to tell your health care provider about all falls, even ones that seem minor. ? You feel dizzy, sleepy, or off-balance.  Take over-the-counter and prescription medicines only as told by your health care provider. These include any supplements.  Eat a healthy diet and maintain a healthy weight. A healthy diet includes low-fat dairy products, low-fat (lean) meats, and fiber from whole grains, beans, and lots of fruits and vegetables. Home safety  Remove any tripping hazards, such as rugs, cords, and clutter.  Install safety equipment such as grab bars in bathrooms and safety rails on stairs.  Keep rooms and walkways well-lit. Activity   Follow a regular exercise program to stay fit. This will help you maintain your balance. Ask your health care provider what types of exercise are appropriate for you.  If you need a cane or walker, use it as recommended by your health care provider.  Wear supportive shoes that have nonskid soles. Lifestyle  Do not drink alcohol if your health care provider tells you not to drink.  If you drink alcohol, limit how much you have: ? 0-1 drink a day for women. ? 0-2 drinks a day for men.  Be aware of how much alcohol is in your drink. In the [place] one drink equals one typical bottle of beer (12 oz), one-half glass of wine (5 oz), or one shot of hard liquor (1 oz).  Do not use any products that contain nicotine or tobacco, such as cigarettes and e-cigarettes. If you need help quitting, ask your health care provider. Summary  Having a healthy lifestyle and getting preventive care can help to protect your health and wellness after age 35.  Screening and testing are the best way to find a health problem early and help you avoid having a fall. Early diagnosis and treatment give you the best chance for managing medical conditions that are more common for people who are older than age 87.  Falls are a major cause of broken bones and head injuries in people  who are older than age 39. Take precautions to prevent a fall at home.  Work with your health care provider to learn what changes you can make to improve your health and wellness and to prevent falls. This information is not intended to replace advice given to you by your health care provider. Make sure you discuss any questions you have with your health care provider. Document Released: 11/23/2016 Document Revised: 11/23/2016 Document Reviewed: 11/23/2016 Elsevier Interactive Patient Education  2019 [employer].

## 2018-07-05 NOTE — Progress Notes (Signed)
I have reviewed the above Medicare WE by Ms Vevelyn Royals and agree with her documentation  J Akelia Husted MD

## 2018-11-09 DIAGNOSIS — R442 Other hallucinations: Secondary | ICD-10-CM | POA: Insufficient documentation

## 2018-11-12 DIAGNOSIS — Z23 Encounter for immunization: Secondary | ICD-10-CM | POA: Diagnosis not present

## 2019-01-12 ENCOUNTER — Other Ambulatory Visit: Payer: Self-pay | Admitting: Family Medicine

## 2019-01-12 DIAGNOSIS — I10 Essential (primary) hypertension: Secondary | ICD-10-CM

## 2019-02-17 IMAGING — US US ABDOMEN LIMITED
1 series · 14 of 23 positions shown · non-contrast
Comparison: Ultrasound 09/03/2015 .

CLINICAL DATA: Cirrhosis.  HCC screening .

EXAM:
US ABDOMEN LIMITED - RIGHT UPPER QUADRANT

[Series 1: us abdomen limited · 0.17mm/px · 14 of 23 slices shown]
[im 1/23]
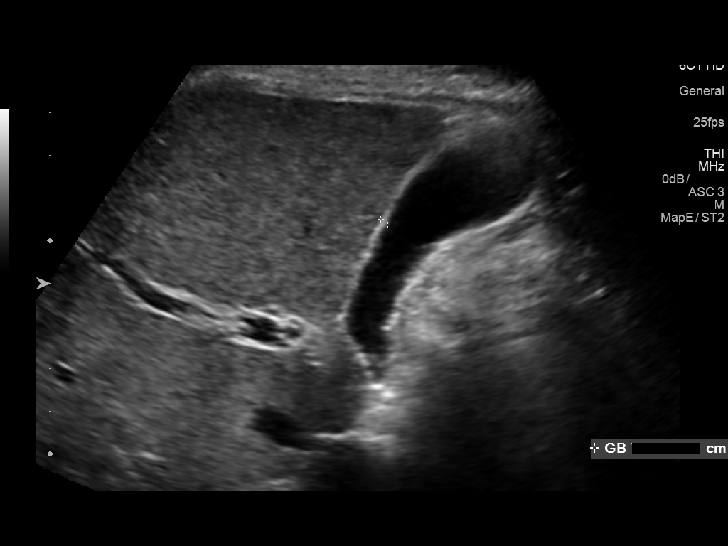
[im 3/23]
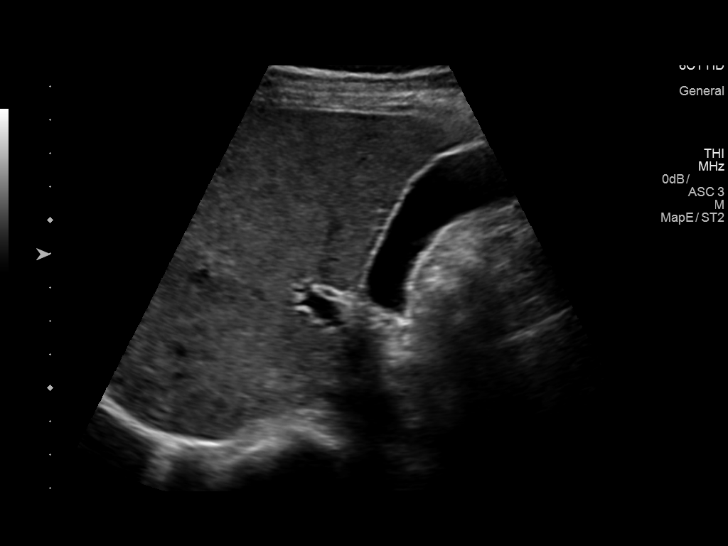
[im 5/23]
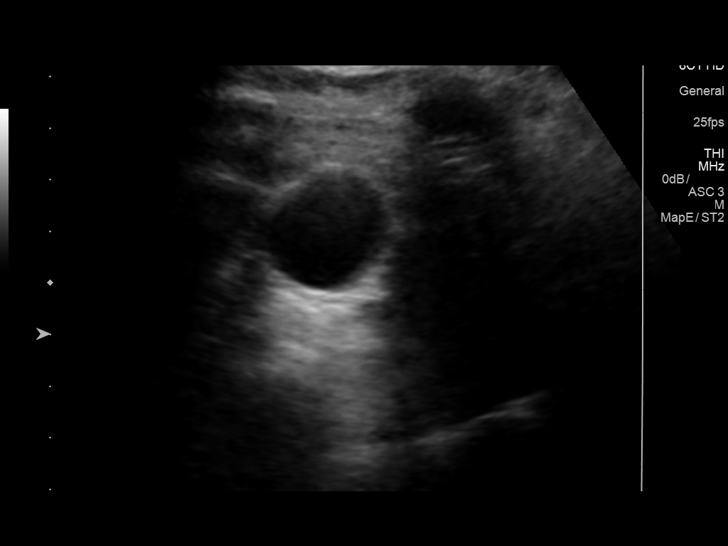
[im 6/23]
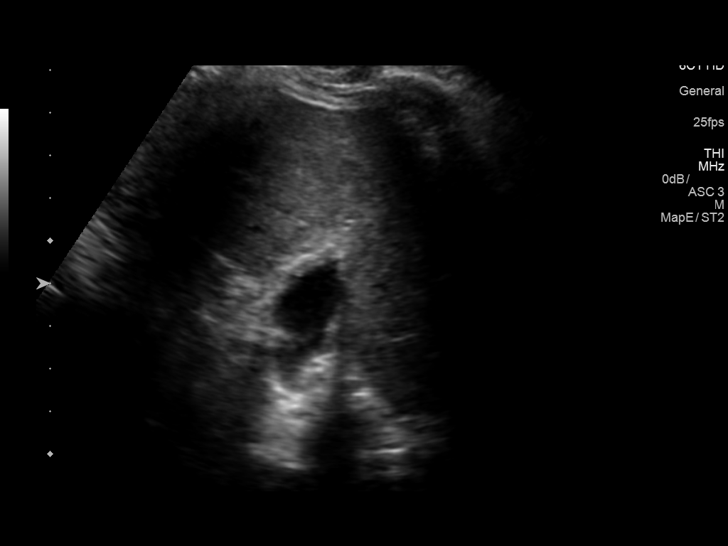
[im 8/23]
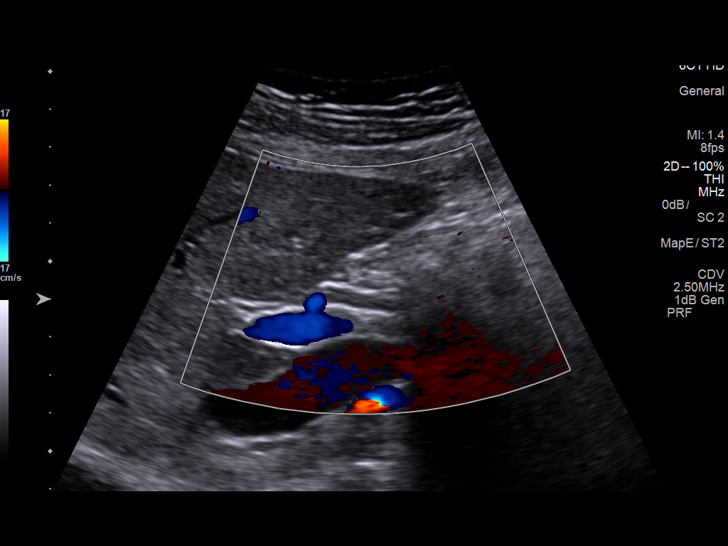
[im 10/23]
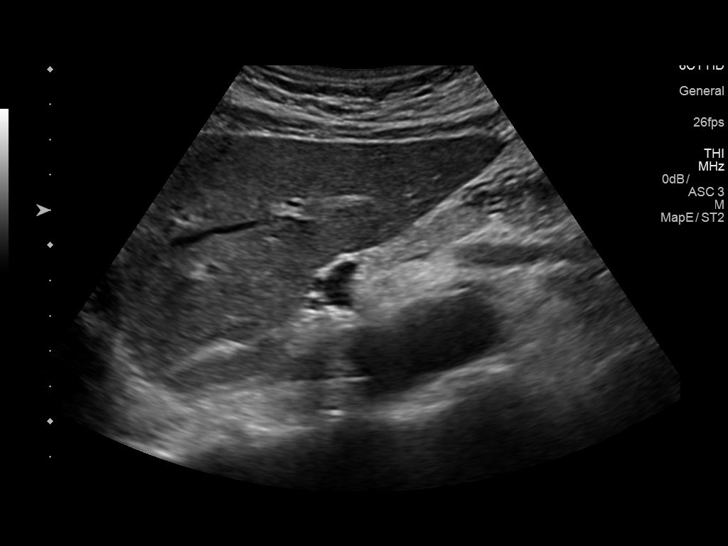
[im 11/23]
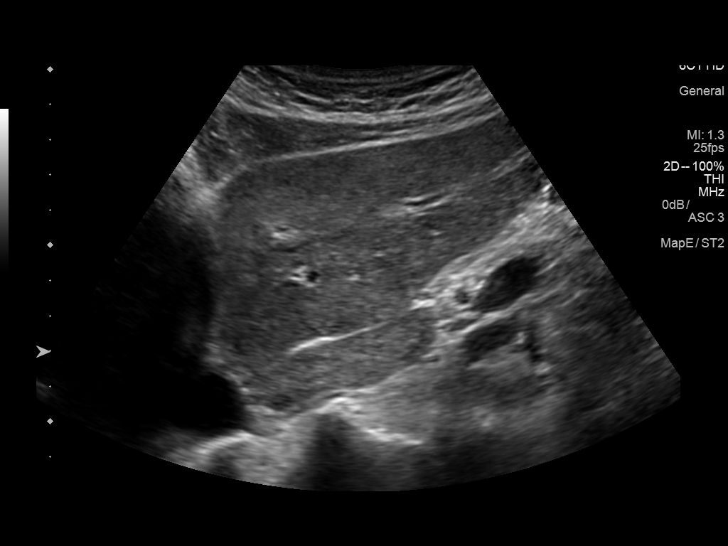
[im 13/23]
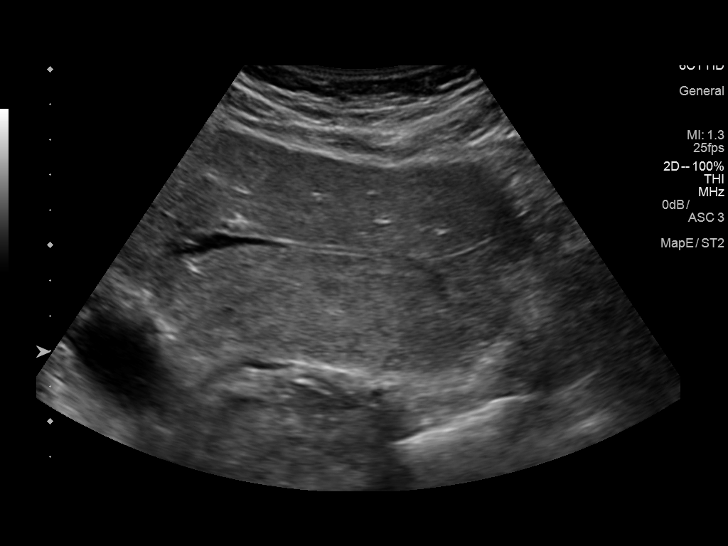
[im 14/23]
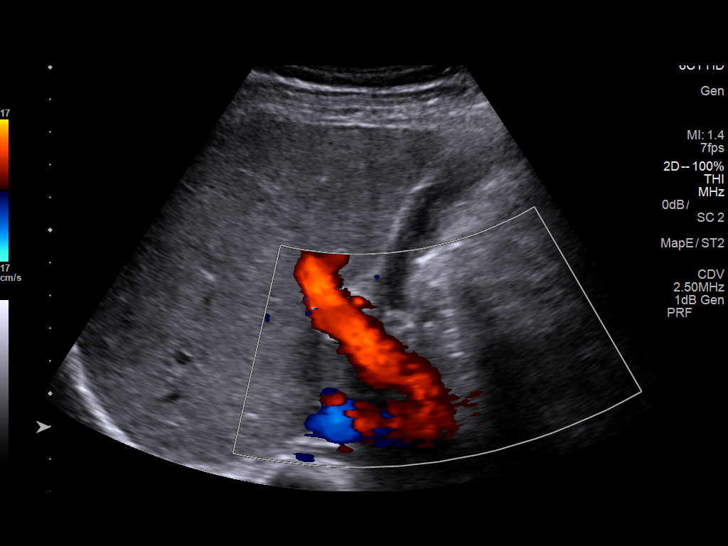
[im 16/23]
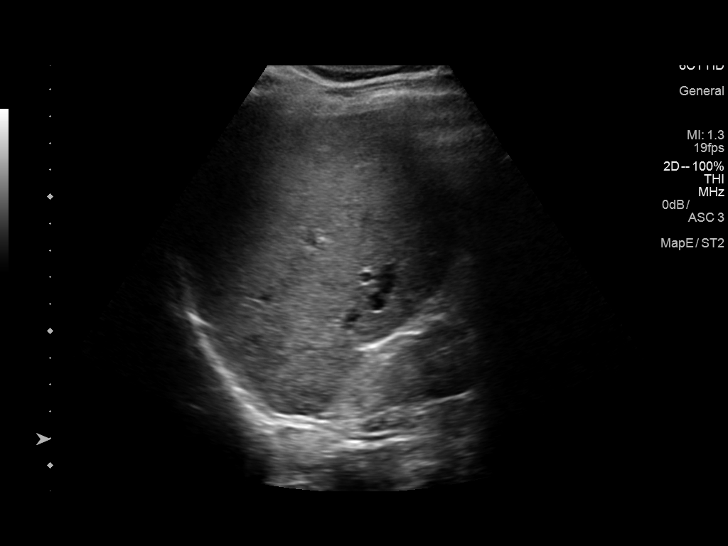
[im 18/23]
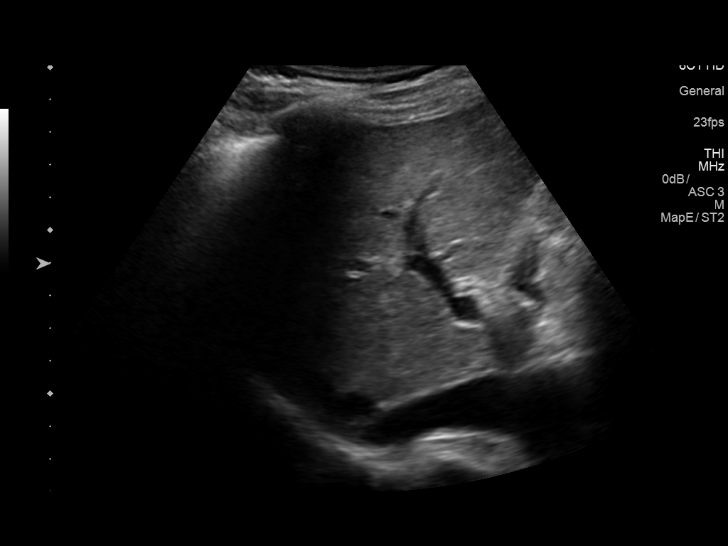
[im 19/23]
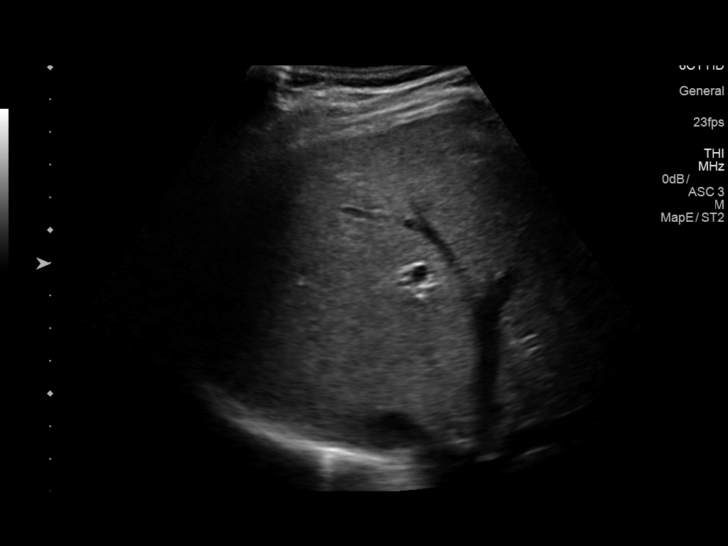
[im 21/23]
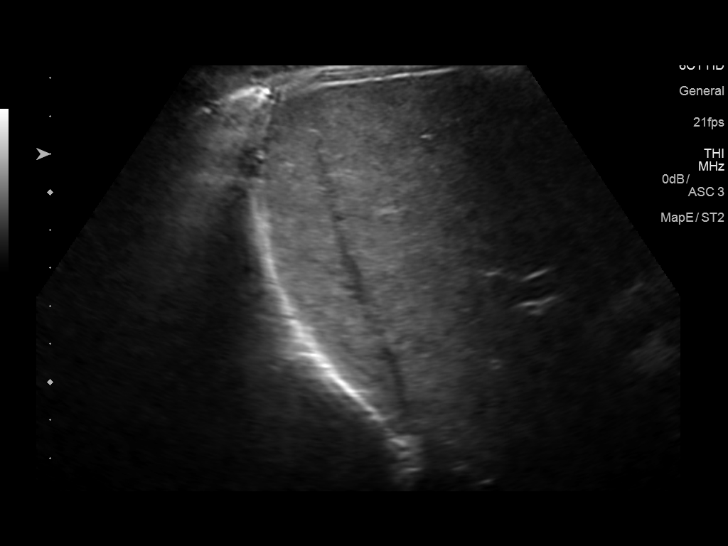
[im 23/23]
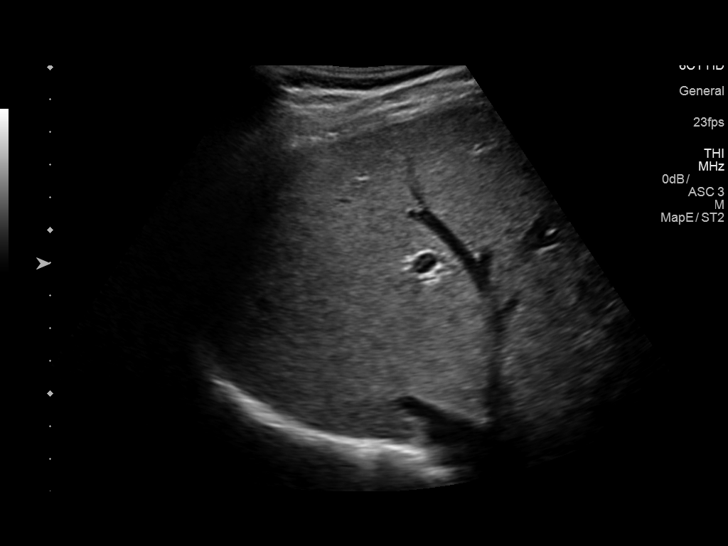

[14 of 23 positions shown; findings below may reference images not displayed]

FINDINGS: Gallbladder:

No gallstones or wall thickening visualized. No sonographic Murphy
sign noted by sonographer.

Common bile duct:

Diameter: 3.4 mm

Liver:

No focal lesion identified. Within normal limits in parenchymal
echogenicity. Portal vein patent.
IMPRESSION: Negative exam.  Exam stable from prior exam.

## 2019-05-27 ENCOUNTER — Other Ambulatory Visit: Payer: Self-pay | Admitting: Family Medicine

## 2019-05-27 DIAGNOSIS — I10 Essential (primary) hypertension: Secondary | ICD-10-CM
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
## 2019-07-05 NOTE — Progress Notes (Signed)
I connected with [name] today by telephone and verified that I am speaking with the correct person using two identifiers. Location patient: home Location provider: work Persons participating in the virtual visit: patient, Marine scientist.    I discussed the limitations, risks, security and privacy concerns of performing an evaluation and management service by telephone and the availability of in person appointments. I also discussed with the patient that there may be a patient responsible charge related to this service. The patient expressed understanding and verbally consented to this telephonic visit.    Interactive audio and video telecommunications were attempted between this RN and patient, however failed, due to patient having technical difficulties OR patient did not have access to video capability.  We continued and completed visit with audio only.  Some vital signs may be absent or patient reported.    Subjective:   Jason Mullins is a 69 y.o. male who presents for Medicare Annual/Subsequent preventive examination.  Pt enjoys playing golf and reading history novels. Pt is traveling to [place] soon to see his sons for his 69th birthday.  Review of Systems:  Home Safety/Smoke Alarms: Feels safe in home. Smoke alarms in place.  Lives w/ wife in 1 story home.   Male:   CCS- pt reports done 4 yrs ago w/ Dr.Hurrelbrink at [facility].    PSA-  Lab Results  Component Value Date   PSA 1.28 04/24/2017   PSA 0.61 05/09/2016   PSA 0.63 05/06/2015       Objective:    Vitals: BP 136/89 Comment: pt reported  Wt 203 lb (92.1 kg)   BMI 24.71 kg/m   Body mass index is 24.71 kg/m.  Advanced Directives 07/08/2019 07/05/2018 06/30/2017 06/21/2016  Does Patient Have a Medical Advance Directive? Yes Yes Yes Yes  Type of Paramedic of [place];Living will [place];Living will [place];Living will [place];Living  will  Does patient want to make changes to medical advance directive? No - Patient declined No - Patient declined - -  Copy of [place] in Chart? No - copy requested Yes - validated most recent copy scanned in chart (See row information) No - copy requested No - copy requested    Tobacco Social History   Tobacco Use  Smoking Status Former Smoker  Smokeless Tobacco Never Used  Tobacco Comment   1 cigar a month     Counseling given: Not Answered Comment: 1 cigar a month   Clinical Intake: Pain : No/denies pain     Past Medical History:  Diagnosis Date  . Asthma   . History of hepatitis C    Cured  . Hypertension    Past Surgical History:  Procedure Laterality Date  . CORRECTION HAMMER TOE     Family History  Problem Relation Age of Onset  . Cancer Mother   . Cancer Father    Social History   Socioeconomic History  . Marital status: Married    Spouse name: Lelan Pons  . Number of children: Not on file  . Years of education: Not on file  . Highest education level: Not on file  Occupational History  . Not on file  Tobacco Use  . Smoking status: Former Research scientist (life sciences)  . Smokeless tobacco: Never Used  . Tobacco comment: 1 cigar a month  Substance and Sexual Activity  . Alcohol use: Yes    Alcohol/week: 2.0 - 4.0 standard drinks    Types: 2 -  4 Cans of beer per week  . Drug use: No  . Sexual activity: Yes  Other Topics Concern  . Not on file  Social History Narrative  . Not on file   Social Determinants of Health   Financial Resource Strain:   . Difficulty of Paying Living Expenses:   Food Insecurity:   . Worried About Programme researcher, broadcasting/film/video in the Last Year:   . Barista in the Last Year:   Transportation Needs:   . Freight forwarder (Medical):   Marland Kitchen Lack of Transportation (Non-Medical):   Physical Activity:   . Days of Exercise per Week:   . Minutes of Exercise per Session:   Stress:   . Feeling of Stress :   Social Connections:     . Frequency of Communication with Friends and Family:   . Frequency of Social Gatherings with Friends and Family:   . Attends Religious Services:   . Active Member of Clubs or Organizations:   . Attends Banker Meetings:   Marland Kitchen Marital Status:     Outpatient Encounter Medications as of 07/08/2019  Medication Sig  . amLODipine (NORVASC) 10 MG tablet TAKE 1 TABLET BY MOUTH EVERY DAY  . hydrochlorothiazide (MICROZIDE) 12.5 MG capsule TAKE 1 CAPSULE BY MOUTH EVERY DAY  . losartan (COZAAR) 100 MG tablet TAKE 1 TABLET BY MOUTH EVERY DAY   No facility-administered encounter medications on file as of 07/08/2019.    Activities of Daily Living In your present state of health, do you have any difficulty performing the following activities: 07/08/2019  Hearing? N  Vision? N  Difficulty concentrating or making decisions? N  Walking or climbing stairs? N  Dressing or bathing? N  Doing errands, shopping? N  Preparing Food and eating ? N  Using the Toilet? N  In the past six months, have you accidently leaked urine? N  Do you have problems with loss of bowel control? N  Managing your Medications? N  Managing your Finances? N  Housekeeping or managing your Housekeeping? N  Some recent data might be hidden    Patient Care Team: Copland, Gwenlyn Found, MD as PCP - General (Family Medicine) Teryl Lucy, MD as Consulting Physician (Orthopedic Surgery) Manuela Schwartz, DDS as Consulting Physician (Dentistry)   Assessment:   This is a routine wellness examination for Normal. Physical assessment deferred to PCP.  Exercise Activities and Dietary recommendations Current Exercise Habits: Home exercise routine, Type of exercise: calisthenics, Time (Minutes): 45, Frequency (Times/Week): 3, Weekly Exercise (Minutes/Week): 135, Intensity: Mild, Exercise limited by: None identified Diet (meal preparation, eat out, water intake, caffeinated beverages, dairy products, fruits and vegetables): well  balanced   Goals    . Maintain current healthy lifestyle.        Fall Risk Fall Risk  07/08/2019 07/05/2018 08/23/2017 06/30/2017 06/21/2016  Falls in the past year? 0 0 No No No  Comment - - Emmi Telephone Survey: data to providers prior to load - -  Number falls in past yr: 0 - - - -  Injury with Fall? 0 - - - -  Follow up Education provided;Falls prevention discussed - - - -   Depression Screen PHQ 2/9 Scores 07/08/2019 07/05/2018 06/30/2017 06/21/2016  PHQ - 2 Score 0 0 0 0  Exception Documentation - - - -    Cognitive Function Ad8 score reviewed for issues:  Issues making decisions:no  Less interest in hobbies / activities:no  Repeats questions, stories (family complaining):no  Trouble using ordinary gadgets (microwave, computer, phone):no  Forgets the month or year: no  Mismanaging finances: no  Remembering appts:no  Daily problems with thinking and/or memory:no Ad8 score is=0     MMSE - Mini Mental State Exam 06/21/2016  Orientation to time 4  Orientation to Place 5  Registration 3  Attention/ Calculation 5  Recall 3  Language- name 2 objects 2  Language- repeat 1  Language- follow 3 step command 3  Language- read & follow direction 1  Write a sentence 1  Copy design 1  Total score 29        Immunization History  Administered Date(s) Administered  . Hepatitis A, Adult 11/05/2014  . Hepatitis B, adult 11/05/2014  . Pneumococcal Conjugate-13 05/06/2015  . Tdap 01/24/2010     Screening Tests Health Maintenance  Topic Date Due  . COVID-19 Vaccine (1) Never done  . PNA vac Low Risk Adult (2 of 2 - PPSV23) 05/09/2021 (Originally 05/05/2016)  . INFLUENZA VACCINE  08/25/2019  . TETANUS/TDAP  01/25/2020  . COLONOSCOPY  01/25/2023  . Hepatitis C Screening  Completed       Plan:     See you next year!   Keep up the great work!  I have personally reviewed and noted the following in the patient's chart:   . Medical and social history . Use of  alcohol, tobacco or illicit drugs  . Current medications and supplements . Functional ability and status . Nutritional status . Physical activity . Advanced directives . List of other physicians . Hospitalizations, surgeries, and ER visits in previous 12 months . Vitals . Screenings to include cognitive, depression, and falls . Referrals and appointments  In addition, I have reviewed and discussed with patient certain preventive protocols, quality metrics, and best practice recommendations. A written personalized care plan for preventive services as well as general preventive health recommendations were provided to patient.   Due to this being a telephonic visit, the after visit summary with patients personalized plan was offered to patient via mail or my-chart. Patient declined at this time.   [place]on Gully, [place]  07/08/2019

## 2019-07-08 ENCOUNTER — Other Ambulatory Visit: Payer: Self-pay

## 2019-07-08 ENCOUNTER — Encounter: Payer: Self-pay | Admitting: *Deleted

## 2019-07-08 ENCOUNTER — Ambulatory Visit (INDEPENDENT_AMBULATORY_CARE_PROVIDER_SITE_OTHER): Payer: Medicare Other | Admitting: *Deleted

## 2019-07-08 VITALS — BP 136/89 | Wt 203.0 lb

## 2019-07-08 DIAGNOSIS — Z0000 Encounter for general adult medical examination without abnormal findings: Secondary | ICD-10-CM | POA: Diagnosis not present

## 2019-07-08 NOTE — Patient Instructions (Addendum)
See you next year!   Keep up the great work!   Jason Mullins , Thank you for taking time to come for your Medicare Wellness Visit. I appreciate your ongoing commitment to your health goals. Please review the following plan we discussed and let me know if I can assist you in the future.   These are the goals we discussed: Goals    . Maintain current healthy lifestyle.        This is a list of the screening recommended for you and due dates:  Health Maintenance  Topic Date Due  . COVID-19 Vaccine (2 - Inadvertent mRNA 2-dose series) 04/22/2019  . Pneumonia vaccines (2 of 2 - PPSV23) 05/09/2021*  . Flu Shot  08/25/2019  . Tetanus Vaccine  01/25/2020  . Colon Cancer Screening  01/25/2023  .  Hepatitis C: One time screening is recommended by Center for Disease Control  (CDC) for  adults born from [birth year] through 1966.   Completed  *Topic was postponed. The date shown is not the original due date.    Preventive Care 63 Years and Older, Male Preventive care refers to lifestyle choices and visits with your health care provider that can promote health and wellness. This includes:  A yearly physical exam. This is also called an annual well check.  Regular dental and eye exams.  Immunizations.  Screening for certain conditions.  Healthy lifestyle choices, such as diet and exercise. What can I expect for my preventive care visit? Physical exam Your health care provider will check:  Height and weight. These may be used to calculate body mass index (BMI), which is a measurement that tells if you are at a healthy weight.  Heart rate and blood pressure.  Your skin for abnormal spots. Counseling Your health care provider may ask you questions about:  Alcohol, tobacco, and drug use.  Emotional well-being.  Home and relationship well-being.  Sexual activity.  Eating habits.  History of falls.  Memory and ability to understand (cognition).  Work and work Statistician. What  immunizations do I need?  Influenza (flu) vaccine  This is recommended every year. Tetanus, diphtheria, and pertussis (Tdap) vaccine  You may need a Td booster every 10 years. Varicella (chickenpox) vaccine  You may need this vaccine if you have not already been vaccinated. Zoster (shingles) vaccine  You may need this after age 61. Pneumococcal conjugate (PCV13) vaccine  One dose is recommended after age 43. Pneumococcal polysaccharide (PPSV23) vaccine  One dose is recommended after age 25. Measles, mumps, and rubella (MMR) vaccine  You may need at least one dose of MMR if you were born in 1957 or later. You may also need a second dose. Meningococcal conjugate (MenACWY) vaccine  You may need this if you have certain conditions. Hepatitis A vaccine  You may need this if you have certain conditions or if you travel or work in places where you may be exposed to hepatitis A. Hepatitis B vaccine  You may need this if you have certain conditions or if you travel or work in places where you may be exposed to hepatitis B. Haemophilus influenzae type b (Hib) vaccine  You may need this if you have certain conditions. You may receive vaccines as individual doses or as more than one vaccine together in one shot (combination vaccines). Talk with your health care provider about the risks and benefits of combination vaccines. What tests do I need? Blood tests  Lipid and cholesterol levels. These may be checked  every 5 years, or more frequently depending on your overall health.  Hepatitis C test.  Hepatitis B test. Screening  Lung cancer screening. You may have this screening every year starting at age 25 if you have a 30-pack-year history of smoking and currently smoke or have quit within the past 15 years.  Colorectal cancer screening. All adults should have this screening starting at age 18 and continuing until age 54. Your health care provider may recommend screening at age 28 if  you are at increased risk. You will have tests every 1-10 years, depending on your results and the type of screening test.  Prostate cancer screening. Recommendations will vary depending on your family history and other risks.  Diabetes screening. This is done by checking your blood sugar (glucose) after you have not eaten for a while (fasting). You may have this done every 1-3 years.  Abdominal aortic aneurysm (AAA) screening. You may need this if you are a current or former smoker.  Sexually transmitted disease (STD) testing. Follow these instructions at home: Eating and drinking  Eat a diet that includes fresh fruits and vegetables, whole grains, lean protein, and low-fat dairy products. Limit your intake of foods with high amounts of sugar, saturated fats, and salt.  Take vitamin and mineral supplements as recommended by your health care provider.  Do not drink alcohol if your health care provider tells you not to drink.  If you drink alcohol: ? Limit how much you have to 0-2 drinks a day. ? Be aware of how much alcohol is in your drink. In the [place] one drink equals one 12 oz bottle of beer (355 mL), one 5 oz glass of wine (148 mL), or one 1 oz glass of hard liquor (44 mL). Lifestyle  Take daily care of your teeth and gums.  Stay active. Exercise for at least 30 minutes on 5 or more days each week.  Do not use any products that contain nicotine or tobacco, such as cigarettes, e-cigarettes, and chewing tobacco. If you need help quitting, ask your health care provider.  If you are sexually active, practice safe sex. Use a condom or other form of protection to prevent STIs (sexually transmitted infections).  Talk with your health care provider about taking a low-dose aspirin or statin. What's next?  Visit your health care provider once a year for a well check visit.  Ask your health care provider how often you should have your eyes and teeth checked.  Stay up to date on all  vaccines. This information is not intended to replace advice given to you by your health care provider. Make sure you discuss any questions you have with your health care provider. Document Revised: 01/04/2018 Document Reviewed: 01/04/2018 Elsevier Patient Education  2020 [employer].

## 2019-07-17 NOTE — Progress Notes (Addendum)
[facility] [address], [address] [place], [place] [address] [phone]  Date:  07/18/2019   Name:  Jason Mullins   DOB:  [birth year]   MRN:  [id number]  PCP:  Jason Cables, MD    Chief Complaint: Annual Exam   History of Present Illness:  Jason Mullins is a 69 y.o. very pleasant male patient who presents with the following:  Pt with history of HTN and OSA, hep C s/p curative treatment with harvoni Here today for a CPE He is generally feeling well We noted bradycardia today.  He is exercising with silver sneakers, 3x a week.  He also plays golf at least once a week No issues with excessive fatigue or SOB-he has not noted any problems with bradycardia at home No CP or SOB Last seen by myself 02/2018 with an unusual smell sense-30 this cleared up He was seen by ENT with [facility]- exam normal   covid vaccine- done  Colon UTD Offer pneumovax- he declines, he had a bad reaction to the first pneumonia vaccine  shingrix-can have done at pharmacy Labs are due - he is fasting today  He notes that he stopped taking all 3 of his BP meds for about a week previously to see what happened.  He notes that his blood pressure looked good for several days, then started to go up again.  He went back on his medications, but wonders if he might be able to reduce some of his dosages He has cut some weight since last year by changing his diet  He has weighed as much as 230 lbs when he retired several years ago He has lost about 25 pounds from his highest weight through diet exercise changes  He has his wife plan to get a 2nd home in [place] and spend some time out there to be closer to family -they are excited about this development   BP Readings from Last 3 Encounters:  07/18/19 138/80  07/08/19 136/89  03/22/18 126/86   Wt Readings from Last 3 Encounters:  07/18/19 204 lb 12.8 oz (92.9 kg)  07/08/19 203 lb (92.1 kg)  03/22/18 217 lb  (98.4 kg)   Pulse Readings from Last 3 Encounters:  07/18/19 (!) 54  03/22/18 95  02/26/18 67   Lab Results  Component Value Date   PSA 1.28 04/24/2017   PSA 0.61 05/09/2016   PSA 0.63 05/06/2015      Patient Active Problem List   Diagnosis Date Noted  . Benign essential HTN 01/30/2014  . Hepatitis C antibody test positive 01/30/2014  . History of Sleep apnea 01/30/2014    Past Medical History:  Diagnosis Date  . Asthma   . History of hepatitis C    Cured  . Hypertension     Past Surgical History:  Procedure Laterality Date  . CORRECTION HAMMER TOE      Social History   Tobacco Use  . Smoking status: Former Smoker    Types: Cigars  . Smokeless tobacco: Never Used  . Tobacco comment: 1 cigar a month  Substance Use Topics  . Alcohol use: Yes    Alcohol/week: 2.0 - 4.0 standard drinks    Types: 2 - 4 Cans of beer per week  . Drug use: No    Family History  Problem Relation Age of Onset  . Cancer Mother   . Cancer Father     No Known Allergies  Medication  list has been reviewed and updated.  No current outpatient medications on file prior to visit.   No current facility-administered medications on file prior to visit.    Review of Systems:  As per HPI- otherwise negative.   Physical Examination: Vitals:   07/18/19 0959 07/18/19 1020  BP: 138/80   Pulse: (!) 43 (!) 54  Resp: 12   Temp: (!) 97.4 F (36.3 C)   SpO2: 98%    Vitals:   07/18/19 0959  Weight: 204 lb 12.8 oz (92.9 kg)  Height: 6\' 4"  (1.93 m)   Body mass index is 24.93 kg/m. Ideal Body Weight: Weight in (lb) to have BMI = 25: 205  GEN: no acute distress. Looks well, tall build  HEENT: Atraumatic, Normocephalic.   Bilateral TM wnl, oropharynx normal.  PEERL,EOMI.   Ears and Nose: No external deformity. CV: RRR, No M/G/R. No JVD. No thrill. No extra heart sounds. PULM: CTA B, no wheezes, crackles, rhonchi. No retractions. No resp. distress. No accessory muscle use. ABD:  S, NT, ND, +BS. No rebound. No HSM. EXTR: No c/c/e PSYCH: Normally interactive. Conversant.    Assessment and Plan: Benign essential HTN - Plan: CBC, Comprehensive metabolic panel, hydrochlorothiazide (MICROZIDE) 12.5 MG capsule, losartan (COZAAR) 100 MG tablet  Physical exam  OSA (obstructive sleep apnea)  Screening for diabetes mellitus - Plan: Comprehensive metabolic panel  Screening for malignant neoplasm of prostate  Screening for hyperlipidemia - Plan: Lipid panel  Essential hypertension - Plan: amLODipine (NORVASC) 5 MG tablet  Screening for prostate cancer - Plan: PSA, Medicare ( [place] only)  Skin irritation - Plan: triamcinolone cream (KENALOG) 0.1 %  Routine check up today for this pt with medicare coverage Labs pending as above He has lost a good amount of weight through diet change. Would like to decrease his BP meds if possible.  We will have him try decreasing amlodipine to 5 mg and see how he responds. I also asked him to watch for any symptomatic or severe bradycardia.  He has not noticed any symptoms of same, but will watch for any presyncope or excessive fatigue He does notice occasional skin irritation and itchiness on his trunk.  No rash is present right now, I prescribed triamcinolone which he may use as needed Will plan further follow- up pending labs.  This visit occurred during the SARS-CoV-2 public health emergency.  Safety protocols were in place, including screening questions prior to the visit, additional usage of staff PPE, and extensive cleaning of exam room while observing appropriate contact time as indicated for disinfecting solutions.    Signed Lamar Blinks, MD  Received his labs as below, message to pt  Results for orders placed or performed in visit on 07/18/19  CBC  Result Value Ref Range   WBC 4.2 4.0 - 10.5 K/uL   RBC 4.70 4.22 - 5.81 Mil/uL   Platelets 169.0 150 - 400 K/uL   Hemoglobin 14.5 13.0 - 17.0 g/dL   HCT 43.0  39 - 52 %   MCV 91.4 78.0 - 100.0 fl   MCHC 33.8 30.0 - 36.0 g/dL   RDW 14.3 11.5 - 15.5 %  Comprehensive metabolic panel  Result Value Ref Range   Sodium 137 135 - 145 mEq/L   Potassium 4.5 3.5 - 5.1 mEq/L   Chloride 104 96 - 112 mEq/L   CO2 29 19 - 32 mEq/L   Glucose, Bld 85 70 - 99 mg/dL   BUN 12 6 - 23 mg/dL  Creatinine, Ser 0.88 0.40 - 1.50 mg/dL   Total Bilirubin 1.3 (H) 0.2 - 1.2 mg/dL   Alkaline Phosphatase 65 39 - 117 U/L   AST 29 0 - 37 U/L   ALT 21 0 - 53 U/L   Total Protein 7.2 6.0 - 8.3 g/dL   Albumin 4.4 3.5 - 5.2 g/dL   GFR 922.30 >09.79 mL/min   Calcium 10.2 8.4 - 10.5 mg/dL  Lipid panel  Result Value Ref Range   Cholesterol 175 0 - 200 mg/dL   Triglycerides 49.9 0 - 149 mg/dL   HDL 71.82 >09.90 mg/dL   VLDL 8.8 0.0 - 68.9 mg/dL   LDL Cholesterol 99 0 - 99 mg/dL   Total CHOL/HDL Ratio 3    NonHDL 107.45   PSA, Medicare ( [place] only)  Result Value Ref Range   PSA 0.79 0.10 - 4.00 ng/ml

## 2019-07-17 NOTE — Patient Instructions (Addendum)
It was good to see you again today Please consider getting the shingles vaccine at your drug store  I will be in touch with your labs asap!   As we discussed, your pulse is on the lower side.  This is likely due to good physical fitness and is not a problem.  However, if your pulse starts to run too low you may feel tired or faint, esp with exercise Please let me know if you notice anything of concern We will reduce your amlodipine to 5 mg, continue losartan and hctz  Triamcinolone cream as needed for rash/ itchy skin    Health Maintenance After Age 54 After age 38, you are at a higher risk for certain long-term diseases and infections as well as injuries from falls. Falls are a major cause of broken bones and head injuries in people who are older than age 59. Getting regular preventive care can help to keep you healthy and well. Preventive care includes getting regular testing and making lifestyle changes as recommended by your health care provider. Talk with your health care provider about:  Which screenings and tests you should have. A screening is a test that checks for a disease when you have no symptoms.  A diet and exercise plan that is right for you. What should I know about screenings and tests to prevent falls? Screening and testing are the best ways to find a health problem early. Early diagnosis and treatment give you the best chance of managing medical conditions that are common after age 80. Certain conditions and lifestyle choices may make you more likely to have a fall. Your health care provider may recommend:  Regular vision checks. Poor vision and conditions such as cataracts can make you more likely to have a fall. If you wear glasses, make sure to get your prescription updated if your vision changes.  Medicine review. Work with your health care provider to regularly review all of the medicines you are taking, including over-the-counter medicines. Ask your health care  provider about any side effects that may make you more likely to have a fall. Tell your health care provider if any medicines that you take make you feel dizzy or sleepy.  Osteoporosis screening. Osteoporosis is a condition that causes the bones to get weaker. This can make the bones weak and cause them to break more easily.  Blood pressure screening. Blood pressure changes and medicines to control blood pressure can make you feel dizzy.  Strength and balance checks. Your health care provider may recommend certain tests to check your strength and balance while standing, walking, or changing positions.  Foot health exam. Foot pain and numbness, as well as not wearing proper footwear, can make you more likely to have a fall.  Depression screening. You may be more likely to have a fall if you have a fear of falling, feel emotionally low, or feel unable to do activities that you used to do.  Alcohol use screening. Using too much alcohol can affect your balance and may make you more likely to have a fall. What actions can I take to lower my risk of falls? General instructions  Talk with your health care provider about your risks for falling. Tell your health care provider if: ? You fall. Be sure to tell your health care provider about all falls, even ones that seem minor. ? You feel dizzy, sleepy, or off-balance.  Take over-the-counter and prescription medicines only as told by your health care provider. These  include any supplements.  Eat a healthy diet and maintain a healthy weight. A healthy diet includes low-fat dairy products, low-fat (lean) meats, and fiber from whole grains, beans, and lots of fruits and vegetables. Home safety  Remove any tripping hazards, such as rugs, cords, and clutter.  Install safety equipment such as grab bars in bathrooms and safety rails on stairs.  Keep rooms and walkways well-lit. Activity   Follow a regular exercise program to stay fit. This will help  you maintain your balance. Ask your health care provider what types of exercise are appropriate for you.  If you need a cane or walker, use it as recommended by your health care provider.  Wear supportive shoes that have nonskid soles. Lifestyle  Do not drink alcohol if your health care provider tells you not to drink.  If you drink alcohol, limit how much you have: ? 0-1 drink a day for women. ? 0-2 drinks a day for men.  Be aware of how much alcohol is in your drink. In the [place] one drink equals one typical bottle of beer (12 oz), one-half glass of wine (5 oz), or one shot of hard liquor (1 oz).  Do not use any products that contain nicotine or tobacco, such as cigarettes and e-cigarettes. If you need help quitting, ask your health care provider. Summary  Having a healthy lifestyle and getting preventive care can help to protect your health and wellness after age 49.  Screening and testing are the best way to find a health problem early and help you avoid having a fall. Early diagnosis and treatment give you the best chance for managing medical conditions that are more common for people who are older than age 40.  Falls are a major cause of broken bones and head injuries in people who are older than age 68. Take precautions to prevent a fall at home.  Work with your health care provider to learn what changes you can make to improve your health and wellness and to prevent falls. This information is not intended to replace advice given to you by your health care provider. Make sure you discuss any questions you have with your health care provider. Document Revised: 05/03/2018 Document Reviewed: 11/23/2016 Elsevier Patient Education  2020 [employer].

## 2019-07-18 ENCOUNTER — Encounter: Payer: Self-pay | Admitting: Family Medicine

## 2019-07-18 ENCOUNTER — Other Ambulatory Visit: Payer: Self-pay

## 2019-07-18 ENCOUNTER — Ambulatory Visit (INDEPENDENT_AMBULATORY_CARE_PROVIDER_SITE_OTHER): Payer: Medicare Other | Admitting: Family Medicine

## 2019-07-18 VITALS — BP 138/80 | HR 54 | Temp 97.4°F | Resp 12 | Ht 76.0 in | Wt 204.8 lb

## 2019-07-18 DIAGNOSIS — Z125 Encounter for screening for malignant neoplasm of prostate: Secondary | ICD-10-CM

## 2019-07-18 DIAGNOSIS — I10 Essential (primary) hypertension: Secondary | ICD-10-CM

## 2019-07-18 DIAGNOSIS — G4733 Obstructive sleep apnea (adult) (pediatric): Secondary | ICD-10-CM

## 2019-07-18 DIAGNOSIS — R238 Other skin changes: Secondary | ICD-10-CM

## 2019-07-18 DIAGNOSIS — Z0000 Encounter for general adult medical examination without abnormal findings: Secondary | ICD-10-CM

## 2019-07-18 DIAGNOSIS — Z131 Encounter for screening for diabetes mellitus: Secondary | ICD-10-CM | POA: Diagnosis not present

## 2019-07-18 DIAGNOSIS — Z13220 Encounter for screening for lipoid disorders: Secondary | ICD-10-CM

## 2019-07-18 LAB — COMPREHENSIVE METABOLIC PANEL
ALT: 21 U/L (ref 0–53)
AST: 29 U/L (ref 0–37)
Albumin: 4.4 g/dL (ref 3.5–5.2)
Alkaline Phosphatase: 65 U/L (ref 39–117)
BUN: 12 mg/dL (ref 6–23)
CO2: 29 mEq/L (ref 19–32)
Calcium: 10.2 mg/dL (ref 8.4–10.5)
Chloride: 104 mEq/L (ref 96–112)
Creatinine, Ser: 0.88 mg/dL (ref 0.40–1.50)
GFR: 103.61 mL/min (ref >60.00)
Glucose, Bld: 85 mg/dL (ref 70–99)
Potassium: 4.5 mEq/L (ref 3.5–5.1)
Sodium: 137 mEq/L (ref 135–145)
Total Bilirubin: 1.3 mg/dL — ABNORMAL HIGH (ref 0.2–1.2)
Total Protein: 7.2 g/dL (ref 6.0–8.3)

## 2019-07-18 LAB — PSA, MEDICARE: PSA: 0.79 ng/ml (ref 0.10–4.00)

## 2019-07-18 LAB — CBC
HCT: 43 % (ref 39.0–52.0)
Hemoglobin: 14.5 g/dL (ref 13.0–17.0)
MCHC: 33.8 g/dL (ref 30.0–36.0)
MCV: 91.4 fl (ref 78.0–100.0)
Platelets: 169 10*3/uL (ref 150.0–400.0)
RBC: 4.7 Mil/uL (ref 4.22–5.81)
RDW: 14.3 % (ref 11.5–15.5)
WBC: 4.2 10*3/uL (ref 4.0–10.5)

## 2019-07-18 LAB — LIPID PANEL
Cholesterol: 175 mg/dL (ref 0–200)
HDL: 67.1 mg/dL (ref >39.00)
LDL Cholesterol: 99 mg/dL (ref 0–99)
NonHDL: 107.45
Total CHOL/HDL Ratio: 3
Triglycerides: 44 mg/dL (ref 0.0–149.0)
VLDL: 8.8 mg/dL (ref 0.0–40.0)

## 2019-07-18 MED ORDER — HYDROCHLOROTHIAZIDE 12.5 MG PO CAPS: ORAL_CAPSULE | ORAL | 3 refills | Status: DC

## 2019-07-18 MED ORDER — AMLODIPINE BESYLATE 5 MG PO TABS: 5.0000 mg | ORAL_TABLET | Freq: Every day | ORAL | 3 refills | Status: DC

## 2019-07-18 MED ORDER — LOSARTAN POTASSIUM 100 MG PO TABS: 100.0000 mg | ORAL_TABLET | Freq: Every day | ORAL | 3 refills | Status: DC

## 2019-07-18 MED ORDER — TRIAMCINOLONE ACETONIDE 0.1 % EX CREA: 1.0000 "application " | TOPICAL_CREAM | Freq: Two times a day (BID) | CUTANEOUS | 0 refills | Status: DC

## 2019-12-31 ENCOUNTER — Other Ambulatory Visit: Payer: Self-pay | Admitting: Family Medicine

## 2019-12-31 ENCOUNTER — Telehealth: Payer: Self-pay | Admitting: Family Medicine

## 2019-12-31 DIAGNOSIS — I10 Essential (primary) hypertension: Secondary | ICD-10-CM

## 2019-12-31 NOTE — Telephone Encounter (Signed)
Medication: losartan (COZAAR) 100 MG tablet    Has the patient contacted their pharmacy? No. (If no, request that the patient contact the pharmacy for the refill.) (If yes, when and what did the pharmacy advise?)  Preferred Pharmacy (with phone number or street name): [id number] [place], [place] - [address] AT [address]OSS [address]  [address], [place] [address]  Phone:  [phone] Fax:  [phone]  DEA #:  [id number]  Agent: Please be advised that RX refills may take up to 3 business days. We ask that you follow-up with your pharmacy.

## 2020-01-01 ENCOUNTER — Other Ambulatory Visit: Payer: Self-pay | Admitting: Family Medicine

## 2020-01-01 ENCOUNTER — Telehealth: Payer: Self-pay | Admitting: Family Medicine

## 2020-01-01 DIAGNOSIS — I10 Essential (primary) hypertension: Secondary | ICD-10-CM

## 2020-01-01 MED ORDER — LOSARTAN POTASSIUM 100 MG PO TABS: 100.0000 mg | ORAL_TABLET | Freq: Every day | ORAL | 3 refills | Status: DC

## 2020-01-01 MED ORDER — HYDROCHLOROTHIAZIDE 12.5 MG PO CAPS: ORAL_CAPSULE | ORAL | 3 refills | Status: DC

## 2020-01-01 NOTE — Telephone Encounter (Signed)
Medication refilled to pharmacy.  

## 2020-01-01 NOTE — Telephone Encounter (Signed)
Medication sent to pharmacy  

## 2020-01-01 NOTE — Telephone Encounter (Signed)
Medication: hydrochlorothiazide (MICROZIDE) 12.5 MG capsule [[id number]]    Pharmacy is need authorization for  medication   [facility] [address]   Call back number for pt [phone]

## 2020-01-02 ENCOUNTER — Telehealth: Payer: Self-pay | Admitting: Family Medicine

## 2020-01-02 DIAGNOSIS — I10 Essential (primary) hypertension: Secondary | ICD-10-CM

## 2020-01-02 MED ORDER — AMLODIPINE BESYLATE 5 MG PO TABS: 5.0000 mg | ORAL_TABLET | Freq: Every day | ORAL | 3 refills | Status: DC

## 2020-01-02 NOTE — Telephone Encounter (Signed)
Rx sent 

## 2020-01-02 NOTE — Telephone Encounter (Signed)
Medication: amLODipine (NORVASC) 5 MG tablet [[id number]]    Has the patient contacted their pharmacy? No. (If no, request that the patient contact the pharmacy for the refill.) (If yes, when and what did the pharmacy advise?)  Preferred Pharmacy (with phone number or street name):  [id number] [place], [place] - [address]OSS [address]  [address], [place] [address]  Phone:  [phone] Fax:  [phone]  DEA #:  [id number]  DAW Reason: --     Agent: Please be advised that RX refills may take up to 3 business days. We ask that you follow-up with your pharmacy.

## 2020-01-07 DIAGNOSIS — M79671 Pain in right foot: Secondary | ICD-10-CM | POA: Diagnosis not present

## 2020-01-07 DIAGNOSIS — M7751 Other enthesopathy of right foot: Secondary | ICD-10-CM | POA: Diagnosis not present

## 2020-02-07 DIAGNOSIS — H40002 Preglaucoma, unspecified, left eye: Secondary | ICD-10-CM | POA: Diagnosis not present

## 2020-02-07 DIAGNOSIS — H04123 Dry eye syndrome of bilateral lacrimal glands: Secondary | ICD-10-CM | POA: Diagnosis not present

## 2020-02-07 DIAGNOSIS — H2513 Age-related nuclear cataract, bilateral: Secondary | ICD-10-CM | POA: Diagnosis not present

## 2020-03-24 ENCOUNTER — Other Ambulatory Visit: Payer: Self-pay | Admitting: Family Medicine

## 2020-03-24 DIAGNOSIS — I10 Essential (primary) hypertension: Secondary | ICD-10-CM

## 2020-03-27 ENCOUNTER — Telehealth: Payer: Self-pay | Admitting: Family Medicine

## 2020-03-27 MED ORDER — LOSARTAN POTASSIUM 50 MG PO TABS: 50.0000 mg | ORAL_TABLET | Freq: Two times a day (BID) | ORAL | 3 refills | Status: DC

## 2020-03-27 NOTE — Telephone Encounter (Signed)
Sent rx in to reflect needs.

## 2020-03-27 NOTE — Telephone Encounter (Signed)
[facility] 50[facility]  [facility]artan) Can you change prescription to Losartan 50 mg take 2 tablet a day. Need 90 day supply  [id number] [place], [place] - [address] AT [address]OSS [address]  [address], [place]  Phone:  [phone] Fax:  [phone]

## 2020-03-30 ENCOUNTER — Other Ambulatory Visit: Payer: Self-pay | Admitting: Family Medicine

## 2020-03-30 DIAGNOSIS — I10 Essential (primary) hypertension: Secondary | ICD-10-CM

## 2020-03-31 NOTE — Telephone Encounter (Signed)
Losartan (all strengths) on long term back order. Pharmacy is requesting to change to telmisartan 80mg . Please advise.

## 2020-04-01 MED ORDER — TELMISARTAN 80 MG PO TABS: 80.0000 mg | ORAL_TABLET | Freq: Every day | ORAL | 3 refills | Status: DC

## 2020-04-01 NOTE — Telephone Encounter (Signed)
Called pt to discuss this sub- he is ok with this rx for telmisartan [facility] [place] [facility] is pt current location

## 2020-05-03 ENCOUNTER — Other Ambulatory Visit: Payer: Self-pay | Admitting: Family Medicine

## 2020-05-03 MED ORDER — TRIAMCINOLONE ACETONIDE 0.5 % EX CREA: 1.0000 "application " | TOPICAL_CREAM | Freq: Two times a day (BID) | CUTANEOUS | 2 refills | Status: DC

## 2020-05-03 NOTE — Progress Notes (Signed)
Pt called- he would like a slightly stronger eczema cream.  Will call in triamcinolone 0.5%

## 2020-07-08 ENCOUNTER — Ambulatory Visit: Payer: Medicare Other | Admitting: *Deleted

## 2020-07-16 ENCOUNTER — Telehealth: Payer: Self-pay | Admitting: Family Medicine

## 2020-07-16 NOTE — Telephone Encounter (Signed)
Copied from CRM [id number]. Topic: Medicare AWV >> Jul 16, 2020 10:10 AM Harris-Coley, Avon Gully wrote: Reason for CRM: Left message for patient to schedule Annual Wellness Visit.  Please schedule with Health Nurse Advisor Clare Gandy. at [facility].

## 2020-09-17 DIAGNOSIS — Z20822 Contact with and (suspected) exposure to covid-19: Secondary | ICD-10-CM | POA: Diagnosis not present

## 2020-12-24 ENCOUNTER — Other Ambulatory Visit: Payer: Self-pay | Admitting: Family Medicine

## 2020-12-24 DIAGNOSIS — I10 Essential (primary) hypertension: Secondary | ICD-10-CM

## 2021-03-16 ENCOUNTER — Other Ambulatory Visit: Payer: Self-pay

## 2021-03-16 ENCOUNTER — Encounter (HOSPITAL_BASED_OUTPATIENT_CLINIC_OR_DEPARTMENT_OTHER): Payer: Self-pay | Admitting: Emergency Medicine

## 2021-03-16 DIAGNOSIS — I10 Essential (primary) hypertension: Secondary | ICD-10-CM | POA: Insufficient documentation

## 2021-03-16 DIAGNOSIS — I160 Hypertensive urgency: Secondary | ICD-10-CM | POA: Insufficient documentation

## 2021-03-16 DIAGNOSIS — Z79899 Other long term (current) drug therapy: Secondary | ICD-10-CM | POA: Insufficient documentation

## 2021-03-16 DIAGNOSIS — R42 Dizziness and giddiness: Secondary | ICD-10-CM | POA: Diagnosis present

## 2021-03-16 NOTE — ED Triage Notes (Signed)
Pt states he has hx of hypertension and has felt light headed all day so he has been monitoring his blood pressure at home and it has been elevated  Pt took an extra dose of his medication this evening

## 2021-03-17 ENCOUNTER — Telehealth: Payer: Self-pay

## 2021-03-17 ENCOUNTER — Emergency Department (HOSPITAL_BASED_OUTPATIENT_CLINIC_OR_DEPARTMENT_OTHER)
Admission: EM | Admit: 2021-03-17 | Discharge: 2021-03-17 | Disposition: A | Discharge status: Home / Self Care | Payer: Medicare Other | Attending: Emergency Medicine | Admitting: Emergency Medicine

## 2021-03-17 DIAGNOSIS — I160 Hypertensive urgency: Secondary | ICD-10-CM

## 2021-03-17 LAB — BASIC METABOLIC PANEL
Anion gap: 7 (ref 5–15)
BUN: 13 mg/dL (ref 8–23)
CO2: 27 mmol/L (ref 22–32)
Calcium: 9.9 mg/dL (ref 8.9–10.3)
Chloride: 100 mmol/L (ref 98–111)
Creatinine, Ser: 0.89 mg/dL (ref 0.61–1.24)
GFR, Estimated: >60 mL/min (ref >60)
Glucose, Bld: 92 mg/dL (ref 70–99)
Potassium: 3.5 mmol/L (ref 3.5–5.1)
Sodium: 134 mmol/L — ABNORMAL LOW (ref 135–145)

## 2021-03-17 LAB — CBC
HCT: 44.5 % (ref 39.0–52.0)
Hemoglobin: 15.4 g/dL (ref 13.0–17.0)
MCH: 31.1 pg (ref 26.0–34.0)
MCHC: 34.6 g/dL (ref 30.0–36.0)
MCV: 89.9 fL (ref 80.0–100.0)
Platelets: 184 10*3/uL (ref 150–400)
RBC: 4.95 MIL/uL (ref 4.22–5.81)
RDW: 13.2 % (ref 11.5–15.5)
WBC: 3.9 10*3/uL — ABNORMAL LOW (ref 4.0–10.5)
nRBC: 0 % (ref 0.0–0.2)

## 2021-03-17 LAB — TROPONIN I (HIGH SENSITIVITY): Troponin I (High Sensitivity): 5 ng/L (ref <18)

## 2021-03-17 NOTE — Telephone Encounter (Signed)
Nurse Assessment Nurse: Dolores Frame, RN, Celeste Date/Time (Eastern Time): 03/16/2021 10:46:44 PM Confirm and document reason for call. If symptomatic, describe symptoms. ---Caller states he has elevated blood pressure 193/130 and is feeling light headed. Sx began earlier today. Last bp was 30 minutes ago. Does the patient have any new or worsening symptoms? ---Yes Will a triage be completed? ---Yes Related visit to physician within the last 2 weeks? ---No Does the PT have any chronic conditions? (i.e. diabetes, asthma, this includes High risk factors for pregnancy, etc.) ---Yes List chronic conditions. ---htn Is this a behavioral health or substance abuse call? ---No Guidelines Guideline Title Affirmed Question Affirmed Notes Nurse Date/Time (Eastern Time) Blood Pressure - High [1] Systolic BP >= 160 OR Diastolic >= 100 AND [2] cardiac or neurologic symptoms (e.g., chest pain, difficulty breathing, unsteady gait, blurred vision) Pitre, RN, Celeste 03/16/2021 10:48:33 PM PLEASE NOTE: All timestamps contained within this report are represented as [place] Standard Time. CONFIDENTIALTY NOTICE: This fax transmission is intended only for the addressee. It contains information that is legally privileged, confidential or otherwise protected from use or disclosure. If you are not the intended recipient, you are strictly prohibited from reviewing, disclosing, copying using or disseminating any of this information or taking any action in reliance on or regarding this information. If you have received this fax in error, please notify us immediately by telephone so that we can arrange for its return to [place]. Phone: [phone], Toll-Free: [phone], Fax: [phone] Page: 2 of 2 Call Id: [id number] Disp. Time Lamount Cohen Time) Disposition Final User 03/16/2021 10:45:43 PM Send to Urgent Queue Nadene Rubins 03/16/2021 10:52:40 PM Go to ED Now Yes Dolores Frame, RN, Park Meo Caller Disagree/Comply  Comply Caller Understands Yes PreDisposition InappropriateToAsk Care Advice Given Per Guideline GO TO ED NOW: * You need to be seen in the Emergency Department. * Go to the ED at ___________ Hospital. * Leave now. Drive carefully. NOTE TO TRIAGER - DRIVING: * Another adult should drive. * Patient should not delay going to the emergency department. * If immediate transportation is not available via car, rideshare (e.g., Lyft, Uber), or taxi, then the patient should be instructed to call [id number]. CARE ADVICE given per High Blood Pressure (Adult) guideline. Referrals [facility] [place] [facility] - ED

## 2021-03-17 NOTE — ED Notes (Signed)
ED Provider at bedside. 

## 2021-03-17 NOTE — ED Provider Notes (Signed)
[facility] EMERGENCY DEPARTMENT Provider Note   CSN: [id number] Arrival date & time: 03/16/21  2323     History  Chief Complaint  Patient presents with   Hypertension    Jason Mullins is a 71 y.o. male.  Pt is a 71 yo male presenting for hypertension. Pt had dizziness and vision changes. Checked bp at home and was 200/. States he did not miss any home doses of antihypertensives including amlodipine, hydrochlorothiazide, and cozzar. States he doubled one dose and BP began to improve. BP 170/ on arrival. Denies chest pain or sob. Admits to resolutions of symptoms at this time.   The history is provided by the patient. No language interpreter was used.  Hypertension Pertinent negatives include no chest pain, no abdominal pain and no shortness of breath.      Home Medications Prior to Admission medications   Medication Sig Start Date End Date Taking? Authorizing Provider  amLODipine (NORVASC) 5 MG tablet TAKE 1 TABLET BY MOUTH EVERY DAY 12/24/20   Copland, Gay Filler, MD  hydrochlorothiazide (MICROZIDE) 12.5 MG capsule TAKE 1 CAPSULE BY MOUTH EVERY DAY 12/24/20   Copland, Gay Filler, MD  losartan (COZAAR) 100 MG tablet TAKE 1 TABLET BY MOUTH EVERY DAY 12/24/20   Copland, Gay Filler, MD  losartan (COZAAR) 50 MG tablet Take 1 tablet (50 mg total) by mouth in the morning and at bedtime. 03/27/20   Shelda Pal, DO  telmisartan (MICARDIS) 80 MG tablet Take 1 tablet (80 mg total) by mouth daily. 04/01/20   Copland, Gay Filler, MD  triamcinolone cream (KENALOG) 0.5 % Apply 1 application topically 2 (two) times daily. Use as needed for eczema 05/03/20   Copland, Gay Filler, MD      Allergies    Patient has no known allergies.    Review of Systems   Review of Systems  Constitutional:  Negative for chills and fever.  HENT:  Negative for ear pain and sore throat.   Eyes:  Positive for visual disturbance. Negative for pain.  Respiratory:  Negative for cough and shortness of  breath.   Cardiovascular:  Negative for chest pain and palpitations.  Gastrointestinal:  Negative for abdominal pain and vomiting.  Genitourinary:  Negative for dysuria and hematuria.  Musculoskeletal:  Negative for arthralgias and back pain.  Skin:  Negative for color change and rash.  Neurological:  Positive for dizziness. Negative for seizures and syncope.  All other systems reviewed and are negative.  Physical Exam Updated Vital Signs BP 127/70    Pulse (!) 51    Temp 97.9 F (36.6 C) (Oral)    Resp 16    Ht 6\' 4"  (1.93 m)    Wt 93.9 kg    SpO2 100%    BMI 25.20 kg/m  Physical Exam Vitals and nursing note reviewed.  Constitutional:      General: He is not in acute distress.    Appearance: He is well-developed.  HENT:     Head: Normocephalic and atraumatic.  Eyes:     General: Lids are normal. Vision grossly intact.     Conjunctiva/sclera: Conjunctivae normal.     Pupils: Pupils are equal, round, and reactive to light.  Cardiovascular:     Rate and Rhythm: Normal rate and regular rhythm.     Heart sounds: No murmur heard. Pulmonary:     Effort: Pulmonary effort is normal. No respiratory distress.     Breath sounds: Normal breath sounds.  Abdominal:  Palpations: Abdomen is soft.     Tenderness: There is no abdominal tenderness.  Musculoskeletal:        General: No swelling.     Cervical back: Neck supple.  Skin:    General: Skin is warm and dry.     Capillary Refill: Capillary refill takes less than 2 seconds.  Neurological:     Mental Status: He is alert.  Psychiatric:        Mood and Affect: Mood normal.    ED Results / Procedures / Treatments   Labs (all labs ordered are listed, but only abnormal results are displayed) Labs Reviewed  CBC  BASIC METABOLIC PANEL  TROPONIN I (HIGH SENSITIVITY)    EKG None  Radiology No results found.  Procedures Procedures    Medications Ordered in ED Medications - No data to display  ED Course/ Medical  Decision Making/ A&P                           Medical Decision Making Amount and/or Complexity of Data Reviewed Labs: ordered.   3:40 AM  70 yo male presenting for symptomatic hypertension that improved after doubling dose of home antihypertensive. On exam pt is Aox3, no acute distress, afebrile, with stable vitals.  Pt is currently asymptomatic. BP is 162/99 without medical intervention in ED. ECG stable with no ST segment elevation or depression. Laboratory studies ordered to rule out hypertensive emergency.   Labs demonstrate no signs of organ dysfunction favoring dx of hypertensive emergency due to BP above 200/. Troponin wnl. Stable renal function.  Patient in no distress and overall condition improved here in the ED. Detailed discussions were had with the patient regarding current findings, and need for close f/u with PCP or on for blood pressure management. The patient has been instructed to return immediately if the symptoms worsen in any way for re-evaluation. Patient verbalized understanding and is in agreement with current care plan. All questions answered prior to discharge.          Final Clinical Impression(s) / ED Diagnoses Final diagnoses:  Hypertensive urgency    Rx / DC Orders ED Discharge Orders     None         Lianne Cure, DO [id number]

## 2021-03-19 NOTE — Patient Instructions (Addendum)
Good to see you again today I would recommend getting a shingles vaccine series, covid booster if due Tetanus given today  I will be in touch with your labs as soon as possible It sounds like your home blood pressures are still a bit elevated.  Continue losartan/HCTZ.  Try increasing your amlodipine to 1-1/2 or 2 tablets daily and please let me know your blood pressure responds

## 2021-03-19 NOTE — Progress Notes (Addendum)
[facility] [place], [place], [place] [address] [phone]  Date:  03/22/2021   Name:  Jason Mullins   DOB:  05-04-1950   MRN:  [id number]  PCP:  Darreld Mclean, MD    Chief Complaint: ER follow up (03/17/21- HTN: no med changes made. )   History of Present Illness:  Jason Mullins is a 71 y.o. very pleasant male patient who presents with the following:  Pt seen today for ER follow-up Last seen by myself 6/21  History of HTN, OSA, hep C s/p curative treatment with harvoni Seen in the ER on 2/22 with hypertensive urgency   Pt notes he was feeling a bit dizzy which is why he checked his BP He is now feeling back to normal  He does admit he might forget his BP meds on occasion but he had not forgotten that day  Pt is a 71 yo male presenting for hypertension. Pt had dizziness and vision changes. Checked bp at home and was 200/. States he did not miss any home doses of antihypertensives including amlodipine, hydrochlorothiazide, and cozzar. States he doubled one dose and BP began to improve. BP 170/ on arrival. Denies chest pain or sob. Admits to resolutions of symptoms at this time. -------  71 yo male presenting for symptomatic hypertension that improved after doubling dose of home antihypertensive. On exam pt is Aox3, no acute distress, afebrile, with stable vitals. Pt is currently asymptomatic. BP is 162/99 without medical intervention in ED. ECG stable with no ST segment elevation or depression. Laboratory studies ordered to rule out hypertensive emergency.  Labs demonstrate no signs of organ dysfunction favoring dx of hypertensive emergency due to BP above 200/. Troponin wnl. Stable renal function.  Shingrix Pt has declined pneumonia booster due to reaction to first shot Covid booster Tetanus is due-update today Flu -patient declines BMP and CBC done at the ER   He has checked his BP at home since this  happened- still a bit elevated, may run approximately 140/ 99 but not as high as it was  No HA No CP or SOB  He feels just fine  He enjoys exercising in the Silver sneakers program at the [employer] Readings from Last 3 Encounters:  03/22/21 212 lb 9.6 oz (96.4 kg)  03/16/21 207 lb (93.9 kg)  07/18/19 204 lb 12.8 oz (92.9 kg)   BP Readings from Last 3 Encounters:  03/22/21 132/80  03/17/21 (!) 162/99  07/18/19 138/80     Patient Active Problem List   Diagnosis Date Noted   Benign essential HTN 01/30/2014   Hepatitis C antibody test positive 01/30/2014   History of Sleep apnea 01/30/2014    Past Medical History:  Diagnosis Date   Asthma    History of hepatitis C    Cured   Hypertension     Past Surgical History:  Procedure Laterality Date   CORRECTION HAMMER TOE      Social History   Tobacco Use   Smoking status: Former    Types: Cigars   Smokeless tobacco: Never   Tobacco comments:    1 cigar a month  Vaping Use   Vaping Use: Never used  Substance Use Topics   Alcohol use: Yes    Alcohol/week: 1.0 standard drink    Types: 1 Cans of beer per week   Drug use: No    Family History  Problem Relation Age of Onset  Cancer Mother    Cancer Father    Cancer Sister     No Known Allergies  Medication list has been reviewed and updated.  Current Outpatient Medications on File Prior to Visit  Medication Sig Dispense Refill   amLODipine (NORVASC) 5 MG tablet TAKE 1 TABLET BY MOUTH EVERY DAY 90 tablet 3   hydrochlorothiazide (MICROZIDE) 12.5 MG capsule TAKE 1 CAPSULE BY MOUTH EVERY DAY 90 capsule 3   losartan (COZAAR) 100 MG tablet TAKE 1 TABLET BY MOUTH EVERY DAY 90 tablet 1   telmisartan (MICARDIS) 80 MG tablet Take 1 tablet (80 mg total) by mouth daily. 90 tablet 3   triamcinolone cream (KENALOG) 0.5 % Apply 1 application topically 2 (two) times daily. Use as needed for eczema 90 g 2   No current facility-administered medications on file prior to visit.     Review of Systems:  As per HPI- otherwise negative.   Physical Examination: Vitals:   03/22/21 1114  BP: 132/80  Pulse: (!) 59  Resp: 18  Temp: 98.2 F (36.8 C)  SpO2: 99%   Vitals:   03/22/21 1114  Weight: 212 lb 9.6 oz (96.4 kg)  Height: 6\' 4"  (1.93 m)   Body mass index is 25.88 kg/m. Ideal Body Weight: Weight in (lb) to have BMI = 25: 205  GEN: no acute distress.  Looks well, tall build with normal weight HEENT: Atraumatic, Normocephalic.  Ears and Nose: No external deformity. CV: RRR, No M/G/R. No JVD. No thrill. No extra heart sounds. PULM: CTA B, no wheezes, crackles, rhonchi. No retractions. No resp. distress. No accessory muscle use. ABD: S, NT, ND, +BS. No rebound. No HSM. EXTR: No c/c/e PSYCH: Normally interactive. Conversant.  Small wound on dorsum of left hand.  No wound care needed but will update tetanus  Assessment and Plan: Essential hypertension - Plan: TSH, Blood Pressure Monitoring (B-D ASSURE BPM/AUTO ARM CUFF) [facility] discharge follow-up  Open wound of left hand without foreign body, unspecified wound type, initial encounter - Plan: Td vaccine greater than or equal to 7yo preservative free IM  Hepatitis C antibody test positive - Plan: Hepatic function panel  Screening for hyperlipidemia - Plan: Lipid panel  Screening for diabetes mellitus  Screening for prostate cancer - Plan: PSA, Medicare ( [facility] only)  Patient seen today with concern of high blood pressure which triggered an ER visit.  Blood pressure looks better today though still slightly elevated.  He notes he was on amlodipine 10 in the past and did fine, no swelling.  We will have him continue his current medications but increase dose of amlodipine from 5-7.5 or 10 mg.  He will continue home monitoring let me know how he does  Update tetanus today Will plan further follow- up pending labs.   Signed Lamar Blinks, MD  Received pt labs as below- letter to  pt  Results for orders placed or performed in visit on 03/22/21  Lipid panel  Result Value Ref Range   Cholesterol 191 0 - 200 mg/dL   Triglycerides 78.0 0.0 - 149.0 mg/dL   HDL 64.80 >39.00 mg/dL   VLDL 15.6 0.0 - 40.0 mg/dL   LDL Cholesterol 111 (H) 0 - 99 mg/dL   Total CHOL/HDL Ratio 3    NonHDL 126.35   TSH  Result Value Ref Range   TSH 2.14 0.35 - 5.50 uIU/mL  Hepatic function panel  Result Value Ref Range   Total Bilirubin 0.6 0.2 - 1.2 mg/dL  Bilirubin, Direct 0.1 0.0 - 0.3 mg/dL   Alkaline Phosphatase 61 39 - 117 U/L   AST 18 0 - 37 U/L   ALT 12 0 - 53 U/L   Total Protein 7.5 6.0 - 8.3 g/dL   Albumin 4.4 3.5 - 5.2 g/dL  PSA, Medicare ( [facility] only)  Result Value Ref Range   PSA 0.65 0.10 - 4.00 ng/ml

## 2021-03-22 ENCOUNTER — Ambulatory Visit (INDEPENDENT_AMBULATORY_CARE_PROVIDER_SITE_OTHER): Payer: Medicare Other | Admitting: Family Medicine

## 2021-03-22 VITALS — BP 132/80 | HR 59 | Temp 98.2°F | Resp 18 | Ht 76.0 in | Wt 212.6 lb

## 2021-03-22 DIAGNOSIS — Z23 Encounter for immunization: Secondary | ICD-10-CM | POA: Diagnosis not present

## 2021-03-22 DIAGNOSIS — R768 Other specified abnormal immunological findings in serum: Secondary | ICD-10-CM

## 2021-03-22 DIAGNOSIS — Z13220 Encounter for screening for lipoid disorders: Secondary | ICD-10-CM

## 2021-03-22 DIAGNOSIS — S61402A Unspecified open wound of left hand, initial encounter: Secondary | ICD-10-CM | POA: Diagnosis not present

## 2021-03-22 DIAGNOSIS — Z131 Encounter for screening for diabetes mellitus: Secondary | ICD-10-CM

## 2021-03-22 DIAGNOSIS — I10 Essential (primary) hypertension: Secondary | ICD-10-CM | POA: Diagnosis not present

## 2021-03-22 DIAGNOSIS — Z09 Encounter for follow-up examination after completed treatment for conditions other than malignant neoplasm: Secondary | ICD-10-CM

## 2021-03-22 DIAGNOSIS — Z125 Encounter for screening for malignant neoplasm of prostate: Secondary | ICD-10-CM | POA: Diagnosis not present

## 2021-03-22 LAB — HEPATIC FUNCTION PANEL
ALT: 12 U/L (ref 0–53)
AST: 18 U/L (ref 0–37)
Albumin: 4.4 g/dL (ref 3.5–5.2)
Alkaline Phosphatase: 61 U/L (ref 39–117)
Bilirubin, Direct: 0.1 mg/dL (ref 0.0–0.3)
Total Bilirubin: 0.6 mg/dL (ref 0.2–1.2)
Total Protein: 7.5 g/dL (ref 6.0–8.3)

## 2021-03-22 LAB — LIPID PANEL
Cholesterol: 191 mg/dL (ref 0–200)
HDL: 64.8 mg/dL (ref >39.00)
LDL Cholesterol: 111 mg/dL — ABNORMAL HIGH (ref 0–99)
NonHDL: 126.35
Total CHOL/HDL Ratio: 3
Triglycerides: 78 mg/dL (ref 0.0–149.0)
VLDL: 15.6 mg/dL (ref 0.0–40.0)

## 2021-03-22 MED ORDER — BD ASSURE BPM/AUTO ARM CUFF MISC: 1.0000 | Freq: Every day | 0 refills | Status: AC | PRN

## 2021-03-23 LAB — PSA, MEDICARE: PSA: 0.65 ng/ml (ref 0.10–4.00)

## 2021-03-23 LAB — TSH: TSH: 2.14 u[IU]/mL (ref 0.35–5.50)

## 2021-04-23 ENCOUNTER — Ambulatory Visit (INDEPENDENT_AMBULATORY_CARE_PROVIDER_SITE_OTHER): Payer: Medicare Other

## 2021-04-23 VITALS — Ht 76.0 in | Wt 212.0 lb

## 2021-04-23 DIAGNOSIS — Z0000 Encounter for general adult medical examination without abnormal findings: Secondary | ICD-10-CM | POA: Diagnosis not present

## 2021-04-23 NOTE — Progress Notes (Signed)
? ?Subjective:  ? Jason Mullins is a 71 y.o. male who presents for Medicare Annual/Subsequent preventive examination. ?Virtual Visit via Telephone Note ? ?I connected with  Jason Mullins on 04/23/21 at  3:45 PM EDT by telephone and verified that I am speaking with the correct person using two identifiers. ? ?Location: ?Patient: HOME ?Provider: [facility] ?Persons participating in the virtual visit: patient/Nurse Health Advisor ?  ?I discussed the limitations, risks, security and privacy concerns of performing an evaluation and management service by telephone and the availability of in person appointments. The patient expressed understanding and agreed to proceed. ? ?Interactive audio and video telecommunications were attempted between this nurse and patient, however failed, due to patient having technical difficulties OR patient did not have access to video capability.  We continued and completed visit with audio only. ? ?Some vital signs may be absent or patient reported.  ? ?Darral DashMary Jane K Rollan Roger, LPN ? ?Review of Systems    ? ?Cardiac Risk Factors include: advanced age (>6755men, 24>65 women);hypertension;male gender ? ?   ?Objective:  ?  ?Today's Vitals  ? 04/23/21 1532  ?Weight: 212 lb (96.2 kg)  ?Height: 6\' 4"  (1.93 m)  ? ?Body mass index is 25.81 kg/m?. ? ? ?  04/23/2021  ?  3:37 PM 03/16/2021  ? 11:46 PM 07/08/2019  ?  9:35 AM 07/05/2018  ?  9:09 AM 06/30/2017  ? 10:24 AM 06/21/2016  ?  9:59 AM  ?Advanced Directives  ?Does Patient Have a Medical Advance Directive? Yes No Yes Yes Yes Yes  ?Type of Advance Directive Living will  Healthcare Power of [place];Living will Healthcare Power of [place];Living will Healthcare Power of [place];Living will Healthcare Power of [place];Living will  ?Does patient want to make changes to medical advance directive?   No - Patient declined No - Patient declined    ?Copy of Healthcare Power of Attorney in Chart?   No - copy requested Yes - validated most recent copy scanned in chart (See  row information) No - copy requested No - copy requested  ?Would patient like information on creating a medical advance directive?  No - Patient declined      ? ? ?Current Medications (verified) ?Outpatient Encounter Medications as of 04/23/2021  ?Medication Sig  ? amLODipine (NORVASC) 5 MG tablet TAKE 1 TABLET BY MOUTH EVERY DAY  ? Blood Pressure Monitoring (B-D ASSURE BPM/AUTO ARM CUFF) MISC 1 Device by Does not apply route daily as needed. Use to check BP as needed  ? hydrochlorothiazide (MICROZIDE) 12.5 MG capsule TAKE 1 CAPSULE BY MOUTH EVERY DAY  ? losartan (COZAAR) 100 MG tablet TAKE 1 TABLET BY MOUTH EVERY DAY  ? triamcinolone cream (KENALOG) 0.5 % Apply 1 application topically 2 (two) times daily. Use as needed for eczema  ? ?No facility-administered encounter medications on file as of 04/23/2021.  ? ? ?Allergies (verified) ?Patient has no known allergies.  ? ?History: ?Past Medical History:  ?Diagnosis Date  ? Asthma   ? History of hepatitis C   ? Cured  ? Hypertension   ? ?Past Surgical History:  ?Procedure Laterality Date  ? CORRECTION HAMMER TOE    ? ?Family History  ?Problem Relation Age of Onset  ? Cancer Mother   ? Cancer Father   ? Cancer Sister   ? ?Social History  ? ?Socioeconomic History  ? Marital status: Married  ?  Spouse name: Jola BabinskiMarilyn  ? Number of children: 2  ? Years of education: Not on file  ?  Highest education level: Not on file  ?Occupational History  ? Not on file  ?Tobacco Use  ? Smoking status: Former  ?  Types: Cigars  ? Smokeless tobacco: Never  ? Tobacco comments:  ?  1 cigar a month  ?Vaping Use  ? Vaping Use: Never used  ?Substance and Sexual Activity  ? Alcohol use: Yes  ?  Alcohol/week: 1.0 standard drink  ?  Types: 1 Cans of beer per week  ? Drug use: No  ? Sexual activity: Yes  ?Other Topics Concern  ? Not on file  ?Social History Narrative  ? Married in 1974.  ? 2 sons.  ? 4 grandsons.  ? Active in church. Goes to Gym 3x per week.   ? ?Social Determinants of Health   ? ?Financial Resource Strain: Low Risk   ? Difficulty of Paying Living Expenses: Not hard at all  ?Food Insecurity: No Food Insecurity  ? Worried About Programme researcher, broadcasting/film/video in the Last Year: Never true  ? Ran Out of Food in the Last Year: Never true  ?Transportation Needs: No Transportation Needs  ? Lack of Transportation (Medical): No  ? Lack of Transportation (Non-Medical): No  ?Physical Activity: Sufficiently Active  ? Days of Exercise per Week: 3 days  ? Minutes of Exercise per Session: 60 min  ?Stress: No Stress Concern Present  ? Feeling of Stress : Not at all  ?Social Connections: Socially Integrated  ? Frequency of Communication with Friends and Family: More than three times a week  ? Frequency of Social Gatherings with Friends and Family: More than three times a week  ? Attends Religious Services: More than 4 times per year  ? Active Member of Clubs or Organizations: Yes  ? Attends Banker Meetings: More than 4 times per year  ? Marital Status: Married  ? ? ?Tobacco Counseling ?Counseling given: Not Answered ?Tobacco comments: 1 cigar a month ? ? ?Clinical Intake: ? ?Pre-visit preparation completed: Yes ? ?Pain : No/denies pain ? ?  ? ?BMI - recorded: 25.81 ?Nutritional Status: BMI 25 -29 Overweight ?Nutritional Risks: None ?Diabetes: No ? ?How often do you need to have someone help you when you read instructions, pamphlets, or other written materials from your doctor or pharmacy?: 1 - Never ? ?Diabetic?No ? ?Interpreter Needed?: No ? ?Information entered by :: mj Azekiel Cremer, lpn ? ? ?Activities of Daily Living ? ?  04/23/2021  ?  3:38 PM  ?In your present state of health, do you have any difficulty performing the following activities:  ?Hearing? 0  ?Vision? 0  ?Difficulty concentrating or making decisions? 0  ?Walking or climbing stairs? 0  ?Dressing or bathing? 0  ?Doing errands, shopping? 0  ?Preparing Food and eating ? N  ?Using the Toilet? N  ?In the past six months, have you accidently leaked  urine? N  ?Do you have problems with loss of bowel control? N  ?Managing your Medications? N  ?Managing your Finances? N  ?Housekeeping or managing your Housekeeping? N  ? ? ?Patient Care Team: ?Copland, Gwenlyn Found, MD as PCP - General (Family Medicine) ?Teryl Lucy, MD as Consulting Physician (Orthopedic Surgery) ?Manuela Schwartz, DDS as Consulting Physician (Dentistry) ? ?Indicate any recent Medical Services you may have received from other than [facility] providers in the past year (date may be approximate). ? ?   ?Assessment:  ? This is a routine wellness examination for Christon. ? ?Hearing/Vision screen ?Hearing Screening - Comments:: No hearing issues. ?Vision  Screening - Comments:: Glasses.  ? ?Dietary issues and exercise activities discussed: ?Current Exercise Habits: Structured exercise class, Type of exercise: strength training/weights;treadmill;stretching, Time (Minutes): 60, Frequency (Times/Week): 3, Weekly Exercise (Minutes/Week): 180, Intensity: Mild, Exercise limited by: cardiac condition(s) ? ? Goals Addressed   ? ?  ?  ?  ?  ? This Visit's Progress  ?  Maintain current healthy lifestyle.    On track  ? ?  ? ?Depression Screen ? ?  04/23/2021  ?  3:35 PM 03/22/2021  ? 11:39 AM 07/18/2019  ? 10:02 AM 07/08/2019  ?  9:38 AM 07/05/2018  ?  9:10 AM 06/30/2017  ? 10:25 AM 06/21/2016  ?  9:55 AM  ?PHQ 2/9 Scores  ?PHQ - 2 Score 0 0 0 0 0 0 0  ?  ?Fall Risk ? ?  04/23/2021  ?  3:38 PM 03/22/2021  ? 11:39 AM 07/18/2019  ? 10:02 AM 07/08/2019  ?  9:38 AM 07/05/2018  ?  9:10 AM  ?Fall Risk   ?Falls in the past year? 0 0 0 0 0  ?Number falls in past yr: 0 0  0   ?Injury with Fall? 0 0  0   ?Risk for fall due to : No Fall Risks      ?Follow up Falls prevention discussed   Education provided;Falls prevention discussed   ? ? ?FALL RISK PREVENTION PERTAINING TO THE HOME: ? ?Any stairs in or around the home? No  ?If so, are there any without handrails? No  ?Home free of loose throw rugs in walkways, pet beds, electrical cords, etc?  Yes  ?Adequate lighting in your home to reduce risk of falls? Yes  ? ?ASSISTIVE DEVICES UTILIZED TO PREVENT FALLS: ? ?Life alert? No  ?Use of a cane, walker or w/c? No  ?Grab bars in the bathroom? Yes  ?Shower chair

## 2021-04-23 NOTE — Patient Instructions (Signed)
Mr. Kraai , ?Thank you for taking time to come for your Medicare Wellness Visit. I appreciate your ongoing commitment to your health goals. Please review the following plan we discussed and let me know if I can assist you in the future.  ? ?Screening recommendations/referrals: ?Colonoscopy: Done 01/24/2013 Repeat in 10 years  ? ?Recommended yearly ophthalmology/optometry visit for glaucoma screening and checkup ?Recommended yearly dental visit for hygiene and checkup ? ?Vaccinations: ?Influenza vaccine: Declined. ?Pneumococcal vaccine: Done 05/06/2015, second dose available at your convenience.  ?Tdap vaccine: Done 03/22/21 Repeat in 10 years ? ?Shingles vaccine: Discussed.   ?Covid-19: Done 03/25/2019. ? ?Advanced directives: Please bring a copy of your health care power of attorney and living will to the office to be added to your chart at your convenience. ? ? ?Conditions/risks identified: KEEP UP THE GOOD WORK!! ? ?Next appointment: Follow up in one year for your annual wellness visit. 2024. ? ?Preventive Care 70 Years and Older, Male ? ?Preventive care refers to lifestyle choices and visits with your health care provider that can promote health and wellness. ?What does preventive care include? ?A yearly physical exam. This is also called an annual well check. ?Dental exams once or twice a year. ?Routine eye exams. Ask your health care provider how often you should have your eyes checked. ?Personal lifestyle choices, including: ?Daily care of your teeth and gums. ?Regular physical activity. ?Eating a healthy diet. ?Avoiding tobacco and drug use. ?Limiting alcohol use. ?Practicing safe sex. ?Taking low doses of aspirin every day. ?Taking vitamin and mineral supplements as recommended by your health care provider. ?What happens during an annual well check? ?The services and screenings done by your health care provider during your annual well check will depend on your age, overall health, lifestyle risk factors, and  family history of disease. ?Counseling  ?Your health care provider may ask you questions about your: ?Alcohol use. ?Tobacco use. ?Drug use. ?Emotional well-being. ?Home and relationship well-being. ?Sexual activity. ?Eating habits. ?History of falls. ?Memory and ability to understand (cognition). ?Work and work Astronomer. ?Screening  ?You may have the following tests or measurements: ?Height, weight, and BMI. ?Blood pressure. ?Lipid and cholesterol levels. These may be checked every 5 years, or more frequently if you are over 45 years old. ?Skin check. ?Lung cancer screening. You may have this screening every year starting at age 49 if you have a 30-pack-year history of smoking and currently smoke or have quit within the past 15 years. ?Fecal occult blood test (FOBT) of the stool. You may have this test every year starting at age 73. ?Flexible sigmoidoscopy or colonoscopy. You may have a sigmoidoscopy every 5 years or a colonoscopy every 10 years starting at age 21. ?Prostate cancer screening. Recommendations will vary depending on your family history and other risks. ?Hepatitis C blood test. ?Hepatitis B blood test. ?Sexually transmitted disease (STD) testing. ?Diabetes screening. This is done by checking your blood sugar (glucose) after you have not eaten for a while (fasting). You may have this done every 1-3 years. ?Abdominal aortic aneurysm (AAA) screening. You may need this if you are a current or former smoker. ?Osteoporosis. You may be screened starting at age 73 if you are at high risk. ?Talk with your health care provider about your test results, treatment options, and if necessary, the need for more tests. ?Vaccines  ?Your health care provider may recommend certain vaccines, such as: ?Influenza vaccine. This is recommended every year. ?Tetanus, diphtheria, and acellular pertussis (Tdap, Td) vaccine.  You may need a Td booster every 10 years. ?Zoster vaccine. You may need this after age 29. ?Pneumococcal  13-valent conjugate (PCV13) vaccine. One dose is recommended after age 54. ?Pneumococcal polysaccharide (PPSV23) vaccine. One dose is recommended after age 15. ?Talk to your health care provider about which screenings and vaccines you need and how often you need them. ?This information is not intended to replace advice given to you by your health care provider. Make sure you discuss any questions you have with your health care provider. ?Document Released: 02/06/2015 Document Revised: 09/30/2015 Document Reviewed: 11/11/2014 ?Elsevier Interactive Patient Education ? 2017 [place]. ? ?Fall Prevention in the Home ?Falls can cause injuries. They can happen to people of all ages. There are many things you can do to make your home safe and to help prevent falls. ?What can I do on the outside of my home? ?Regularly fix the edges of walkways and driveways and fix any cracks. ?Remove anything that might make you trip as you walk through a door, such as a raised step or threshold. ?Trim any bushes or trees on the path to your home. ?Use bright outdoor lighting. ?Clear any walking paths of anything that might make someone trip, such as rocks or tools. ?Regularly check to see if handrails are loose or broken. Make sure that both sides of any steps have handrails. ?Any raised decks and porches should have guardrails on the edges. ?Have any leaves, snow, or ice cleared regularly. ?Use sand or salt on walking paths during winter. ?Clean up any spills in your garage right away. This includes oil or grease spills. ?What can I do in the bathroom? ?Use night lights. ?Install grab bars by the toilet and in the tub and shower. Do not use towel bars as grab bars. ?Use non-skid mats or decals in the tub or shower. ?If you need to sit down in the shower, use a plastic, non-slip stool. ?Keep the floor dry. Clean up any water that spills on the floor as soon as it happens. ?Remove soap buildup in the tub or shower regularly. ?Attach  bath mats securely with double-sided non-slip rug tape. ?Do not have throw rugs and other things on the floor that can make you trip. ?What can I do in the bedroom? ?Use night lights. ?Make sure that you have a light by your bed that is easy to reach. ?Do not use any sheets or blankets that are too big for your bed. They should not hang down onto the floor. ?Have a firm chair that has side arms. You can use this for support while you get dressed. ?Do not have throw rugs and other things on the floor that can make you trip. ?What can I do in the kitchen? ?Clean up any spills right away. ?Avoid walking on wet floors. ?Keep items that you use a lot in easy-to-reach places. ?If you need to reach something above you, use a strong step stool that has a grab bar. ?Keep electrical cords out of the way. ?Do not use floor polish or wax that makes floors slippery. If you must use wax, use non-skid floor wax. ?Do not have throw rugs and other things on the floor that can make you trip. ?What can I do with my stairs? ?Do not leave any items on the stairs. ?Make sure that there are handrails on both sides of the stairs and use them. Fix handrails that are broken or loose. Make sure that handrails are as long as  the stairways. ?Check any carpeting to make sure that it is firmly attached to the stairs. Fix any carpet that is loose or worn. ?Avoid having throw rugs at the top or bottom of the stairs. If you do have throw rugs, attach them to the floor with carpet tape. ?Make sure that you have a light switch at the top of the stairs and the bottom of the stairs. If you do not have them, ask someone to add them for you. ?What else can I do to help prevent falls? ?Wear shoes that: ?Do not have high heels. ?Have rubber bottoms. ?Are comfortable and fit you well. ?Are closed at the toe. Do not wear sandals. ?If you use a stepladder: ?Make sure that it is fully opened. Do not climb a closed stepladder. ?Make sure that both sides of the  stepladder are locked into place. ?Ask someone to hold it for you, if possible. ?Clearly mark and make sure that you can see: ?Any grab bars or handrails. ?First and last steps. ?Where the edge of each step

## 2021-08-12 ENCOUNTER — Other Ambulatory Visit: Payer: Self-pay | Admitting: Family Medicine

## 2021-11-13 NOTE — Progress Notes (Unsigned)
[facility] [address] [place], [place] [address] [phone]  Date:  11/17/2021   Name:  Jason Mullins   DOB:  05/26/1950   MRN:  [id number]  PCP:  Darreld Mclean, MD    Chief Complaint: No chief complaint on file.   History of Present Illness:  Jason Mullins is a 71 y.o. 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   Benign essential HTN 01/30/2014   Hepatitis C antibody test positive 01/30/2014   History of Sleep apnea 01/30/2014    Past Medical History:  Diagnosis Date   Asthma    History of hepatitis C    Cured   Hypertension     Past Surgical History:  Procedure Laterality Date   CORRECTION HAMMER TOE      Social History   Tobacco Use   Smoking status: Former    Types: Cigars   Smokeless tobacco: Never   Tobacco comments:    1 cigar a month  Vaping Use   Vaping Use: Never used  Substance Use Topics   Alcohol use: Yes    Alcohol/week: 1.0 standard drink of alcohol    Types: 1 Cans of beer per week   Drug use: No    Family History  Problem Relation Age of Onset   Cancer Mother    Cancer Father    Cancer Sister     No Known Allergies  Medication list has been reviewed and updated.  Current Outpatient Medications on File Prior to Visit  Medication Sig Dispense Refill   amLODipine (NORVASC) 5 MG tablet TAKE 1 TABLET BY MOUTH EVERY DAY 90 tablet 3   Blood Pressure Monitoring (B-D ASSURE BPM/AUTO ARM CUFF) MISC 1 Device by Does not apply route daily as needed. Use to check BP as needed 1 each 0   hydrochlorothiazide (MICROZIDE) 12.5 MG capsule TAKE 1 CAPSULE BY MOUTH EVERY DAY 90 capsule 3   losartan (COZAAR) 100 MG tablet  TAKE 1 TABLET BY MOUTH EVERY DAY 90 tablet 1   triamcinolone cream (KENALOG) 0.5 % Apply 1 application topically 2 (two) times daily. Use as needed for eczema 90 g 2   No current facility-administered medications on file prior to visit.    Review of Systems:  As per HPI- otherwise negative.   Physical Examination: There were no vitals filed for this visit. There were no vitals filed for this visit. There is no height or weight on file to calculate BMI. Ideal Body Weight:    GEN: no acute distress. HEENT: Atraumatic, Normocephalic.  Ears and Nose: No external deformity. CV: RRR, No M/G/R. No JVD. No thrill. No extra heart sounds. PULM: CTA B, no wheezes, crackles, rhonchi. No retractions. No resp. distress. No accessory muscle use. ABD: S, NT, ND, +BS. No rebound. No HSM. EXTR: No c/c/e PSYCH: Normally interactive. Conversant.    Assessment and Plan: ***  Signed Lamar Blinks, MD

## 2021-11-17 ENCOUNTER — Ambulatory Visit (INDEPENDENT_AMBULATORY_CARE_PROVIDER_SITE_OTHER): Payer: Medicare Other | Admitting: Family Medicine

## 2021-11-17 ENCOUNTER — Encounter: Payer: Self-pay | Admitting: *Deleted

## 2021-11-17 VITALS — BP 142/80 | HR 60 | Temp 97.7°F | Resp 18 | Ht 76.0 in | Wt 205.8 lb

## 2021-11-17 DIAGNOSIS — Z125 Encounter for screening for malignant neoplasm of prostate: Secondary | ICD-10-CM | POA: Diagnosis not present

## 2021-11-17 DIAGNOSIS — Z131 Encounter for screening for diabetes mellitus: Secondary | ICD-10-CM

## 2021-11-17 DIAGNOSIS — M26621 Arthralgia of right temporomandibular joint: Secondary | ICD-10-CM

## 2021-11-17 DIAGNOSIS — I499 Cardiac arrhythmia, unspecified: Secondary | ICD-10-CM | POA: Diagnosis not present

## 2021-11-17 DIAGNOSIS — Z13220 Encounter for screening for lipoid disorders: Secondary | ICD-10-CM

## 2021-11-17 DIAGNOSIS — I10 Essential (primary) hypertension: Secondary | ICD-10-CM | POA: Diagnosis not present

## 2021-11-17 DIAGNOSIS — L309 Dermatitis, unspecified: Secondary | ICD-10-CM | POA: Diagnosis not present

## 2021-11-17 DIAGNOSIS — N529 Male erectile dysfunction, unspecified: Secondary | ICD-10-CM | POA: Diagnosis not present

## 2021-11-17 MED ORDER — MELOXICAM 7.5 MG PO TABS: 7.5000 mg | ORAL_TABLET | Freq: Every day | ORAL | 0 refills | Status: DC

## 2021-11-17 MED ORDER — TRIAMCINOLONE ACETONIDE 0.5 % EX CREA: 1.0000 | TOPICAL_CREAM | Freq: Two times a day (BID) | CUTANEOUS | 2 refills | Status: AC

## 2021-11-17 MED ORDER — SILDENAFIL CITRATE 20 MG PO TABS: ORAL_TABLET | ORAL | 1 refills | Status: DC

## 2021-11-17 NOTE — Patient Instructions (Signed)
Good to see you today Try the meloxicam for a couple of weeks for TMJ pain- let me know if this does not get better Consider getting a coronary calcium score to help determine your future risk of heart disease- I am glad to order this for you  Try the sildenafil as needed- let me know how this works for you

## 2021-11-18 ENCOUNTER — Encounter: Payer: Self-pay | Admitting: Family Medicine

## 2021-11-18 LAB — LIPID PANEL
Cholesterol: 173 mg/dL (ref 0–200)
HDL: 66.3 mg/dL (ref >39.00)
LDL Cholesterol: 89 mg/dL (ref 0–99)
NonHDL: 107.13
Total CHOL/HDL Ratio: 3
Triglycerides: 91 mg/dL (ref 0.0–149.0)
VLDL: 18.2 mg/dL (ref 0.0–40.0)

## 2021-11-18 LAB — PSA, MEDICARE: PSA: 0.87 ng/ml (ref 0.10–4.00)

## 2021-11-18 LAB — COMPREHENSIVE METABOLIC PANEL
ALT: 13 U/L (ref 0–53)
AST: 20 U/L (ref 0–37)
Albumin: 4.3 g/dL (ref 3.5–5.2)
Alkaline Phosphatase: 53 U/L (ref 39–117)
BUN: 18 mg/dL (ref 6–23)
CO2: 30 mEq/L (ref 19–32)
Calcium: 10.2 mg/dL (ref 8.4–10.5)
Chloride: 103 mEq/L (ref 96–112)
Creatinine, Ser: 0.92 mg/dL (ref 0.40–1.50)
GFR: 83.24 mL/min (ref >60.00)
Glucose, Bld: 78 mg/dL (ref 70–99)
Potassium: 4.1 mEq/L (ref 3.5–5.1)
Sodium: 140 mEq/L (ref 135–145)
Total Bilirubin: 0.6 mg/dL (ref 0.2–1.2)
Total Protein: 7 g/dL (ref 6.0–8.3)

## 2021-11-18 LAB — CBC
HCT: 41 % (ref 39.0–52.0)
Hemoglobin: 13.4 g/dL (ref 13.0–17.0)
MCHC: 32.7 g/dL (ref 30.0–36.0)
MCV: 92.3 fl (ref 78.0–100.0)
Platelets: 181 10*3/uL (ref 150.0–400.0)
RBC: 4.44 Mil/uL (ref 4.22–5.81)
RDW: 14.4 % (ref 11.5–15.5)
WBC: 4.7 10*3/uL (ref 4.0–10.5)

## 2021-12-23 ENCOUNTER — Other Ambulatory Visit: Payer: Self-pay | Admitting: Family Medicine

## 2021-12-23 DIAGNOSIS — M26621 Arthralgia of right temporomandibular joint: Secondary | ICD-10-CM

## 2022-01-03 ENCOUNTER — Other Ambulatory Visit: Payer: Self-pay

## 2022-01-03 ENCOUNTER — Telehealth: Payer: Self-pay | Admitting: Family Medicine

## 2022-01-03 DIAGNOSIS — I10 Essential (primary) hypertension: Secondary | ICD-10-CM

## 2022-01-03 MED ORDER — HYDROCHLOROTHIAZIDE 12.5 MG PO CAPS: 12.5000 mg | ORAL_CAPSULE | Freq: Every day | ORAL | 0 refills | Status: DC

## 2022-01-03 MED ORDER — AMLODIPINE BESYLATE 5 MG PO TABS: 5.0000 mg | ORAL_TABLET | Freq: Every day | ORAL | 3 refills | Status: DC

## 2022-01-03 NOTE — Telephone Encounter (Signed)
Refilled

## 2022-01-03 NOTE — Telephone Encounter (Signed)
Pt states he does not think he is due for a f/u.  Please advise.   hydrochlorothiazide (MICROZIDE) 12.5 MG capsule  amLODipine (NORVASC) 5 MG tablet   [id number] - [place], [place] - [address] AT [address]OSS [address], [place] [address] Phone: [phone]  Fax: [phone]

## 2022-01-31 DIAGNOSIS — H2513 Age-related nuclear cataract, bilateral: Secondary | ICD-10-CM | POA: Diagnosis not present

## 2022-01-31 DIAGNOSIS — H04123 Dry eye syndrome of bilateral lacrimal glands: Secondary | ICD-10-CM | POA: Diagnosis not present

## 2022-01-31 DIAGNOSIS — H40002 Preglaucoma, unspecified, left eye: Secondary | ICD-10-CM | POA: Diagnosis not present

## 2022-03-15 NOTE — Progress Notes (Signed)
[facility] Quality Team Note  Name: Kentavion Wanamaker Date of Birth: 1950/02/23 MRN: [id number] Date: 03/15/2022  [facility] Quality Team has reviewed this patient's chart, please see recommendations below:  [facility] Quality Other; ([place] BLOOD PRESSURE BELOW 140/90. PATIENT HAS UPCOMING APPT 04/26/2022 WITH PCP)

## 2022-03-21 DIAGNOSIS — M25511 Pain in right shoulder: Secondary | ICD-10-CM | POA: Diagnosis not present

## 2022-03-28 ENCOUNTER — Other Ambulatory Visit: Payer: Self-pay | Admitting: Family Medicine

## 2022-03-28 ENCOUNTER — Encounter: Payer: Self-pay | Admitting: *Deleted

## 2022-04-07 ENCOUNTER — Other Ambulatory Visit: Payer: Self-pay | Admitting: Family Medicine

## 2022-04-07 DIAGNOSIS — I10 Essential (primary) hypertension: Secondary | ICD-10-CM

## 2022-04-13 ENCOUNTER — Encounter: Payer: Self-pay | Admitting: *Deleted

## 2022-04-26 ENCOUNTER — Telehealth: Payer: Self-pay | Admitting: Family Medicine

## 2022-04-26 NOTE — Telephone Encounter (Signed)
Contacted Stann Mainland to schedule their annual wellness visit. Appointment made for 05/05/2022.  Sherol Dade; Care Guide Ambulatory Clinical [facility] Direct Dial: [phone]

## 2022-05-05 ENCOUNTER — Ambulatory Visit (INDEPENDENT_AMBULATORY_CARE_PROVIDER_SITE_OTHER): Payer: Medicare Other

## 2022-05-05 VITALS — Ht 76.0 in | Wt 210.0 lb

## 2022-05-05 DIAGNOSIS — Z0000 Encounter for general adult medical examination without abnormal findings: Secondary | ICD-10-CM | POA: Diagnosis not present

## 2022-05-05 NOTE — Patient Instructions (Addendum)
[name] , Thank you for taking time to come for your Medicare Wellness Visit. I appreciate your ongoing commitment to your health goals. Please review the following plan we discussed and let me know if I can assist you in the future.   These are the goals we discussed:  Goals      My goal is to decrease my shoulder pain.        This is a list of the screening recommended for you and due dates:  Health Maintenance  Topic Date Due   Colon Cancer Screening  01/25/2023   Medicare Annual Wellness Visit  05/05/2023   DTaP/Tdap/Td vaccine (3 - Td or Tdap) 03/23/2031   Hepatitis C Screening: USPSTF Recommendation to screen - Ages 41-79 yo.  Completed   HPV Vaccine  Aged Out   Pneumonia Vaccine  Discontinued   Flu Shot  Discontinued   COVID-19 Vaccine  Discontinued   Zoster (Shingles) Vaccine  Discontinued    Advanced directives: Yes; Please bring a copy of your health care power of attorney and living will to the office at your convenience.  Conditions/risks identified: Yes  Next appointment: Follow up in one year for your annual wellness visit.   Preventive Care 35 Years and Older, Male  Preventive care refers to lifestyle choices and visits with your health care provider that can promote health and wellness. What does preventive care include? A yearly physical exam. This is also called an annual well check. Dental exams once or twice a year. Routine eye exams. Ask your health care provider how often you should have your eyes checked. Personal lifestyle choices, including: Daily care of your teeth and gums. Regular physical activity. Eating a healthy diet. Avoiding tobacco and drug use. Limiting alcohol use. Practicing safe sex. Taking low doses of aspirin every day. Taking vitamin and mineral supplements as recommended by your health care provider. What happens during an annual well check? The services and screenings done by your health care provider during your annual  well check will depend on your age, overall health, lifestyle risk factors, and family history of disease. Counseling  Your health care provider may ask you questions about your: Alcohol use. Tobacco use. Drug use. Emotional well-being. Home and relationship well-being. Sexual activity. Eating habits. History of falls. Memory and ability to understand (cognition). Work and work Astronomer. Screening  You may have the following tests or measurements: Height, weight, and BMI. Blood pressure. Lipid and cholesterol levels. These may be checked every 5 years, or more frequently if you are over 36 years old. Skin check. Lung cancer screening. You may have this screening every year starting at age 63 if you have a 30-pack-year history of smoking and currently smoke or have quit within the past 15 years. Fecal occult blood test (FOBT) of the stool. You may have this test every year starting at age 69. Flexible sigmoidoscopy or colonoscopy. You may have a sigmoidoscopy every 5 years or a colonoscopy every 10 years starting at age 11. Prostate cancer screening. Recommendations will vary depending on your family history and other risks. Hepatitis C blood test. Hepatitis B blood test. Sexually transmitted disease (STD) testing. Diabetes screening. This is done by checking your blood sugar (glucose) after you have not eaten for a while (fasting). You may have this done every 1-3 years. Abdominal aortic aneurysm (AAA) screening. You may need this if you are a current or former smoker. Osteoporosis. You may be screened starting at age 69 if you  are at high risk. Talk with your health care provider about your test results, treatment options, and if necessary, the need for more tests. Vaccines  Your health care provider may recommend certain vaccines, such as: Influenza vaccine. This is recommended every year. Tetanus, diphtheria, and acellular pertussis (Tdap, Td) vaccine. You may need a Td booster  every 10 years. Zoster vaccine. You may need this after age 36. Pneumococcal 13-valent conjugate (PCV13) vaccine. One dose is recommended after age 52. Pneumococcal polysaccharide (PPSV23) vaccine. One dose is recommended after age 69. Talk to your health care provider about which screenings and vaccines you need and how often you need them. This information is not intended to replace advice given to you by your health care provider. Make sure you discuss any questions you have with your health care provider. Document Released: 02/06/2015 Document Revised: 09/30/2015 Document Reviewed: 11/11/2014 Elsevier Interactive Patient Education  2017 [place] Prevention in the Home Falls can cause injuries. They can happen to people of all ages. There are many things you can do to make your home safe and to help prevent falls. What can I do on the outside of my home? Regularly fix the edges of walkways and driveways and fix any cracks. Remove anything that might make you trip as you walk through a door, such as a raised step or threshold. Trim any bushes or trees on the path to your home. Use bright outdoor lighting. Clear any walking paths of anything that might make someone trip, such as rocks or tools. Regularly check to see if handrails are loose or broken. Make sure that both sides of any steps have handrails. Any raised decks and porches should have guardrails on the edges. Have any leaves, snow, or ice cleared regularly. Use sand or salt on walking paths during winter. Clean up any spills in your garage right away. This includes oil or grease spills. What can I do in the bathroom? Use night lights. Install grab bars by the toilet and in the tub and shower. Do not use towel bars as grab bars. Use non-skid mats or decals in the tub or shower. If you need to sit down in the shower, use a plastic, non-slip stool. Keep the floor dry. Clean up any water that spills on the floor as soon  as it happens. Remove soap buildup in the tub or shower regularly. Attach bath mats securely with double-sided non-slip rug tape. Do not have throw rugs and other things on the floor that can make you trip. What can I do in the bedroom? Use night lights. Make sure that you have a light by your bed that is easy to reach. Do not use any sheets or blankets that are too big for your bed. They should not hang down onto the floor. Have a firm chair that has side arms. You can use this for support while you get dressed. Do not have throw rugs and other things on the floor that can make you trip. What can I do in the kitchen? Clean up any spills right away. Avoid walking on wet floors. Keep items that you use a lot in easy-to-reach places. If you need to reach something above you, use a strong step stool that has a grab bar. Keep electrical cords out of the way. Do not use floor polish or wax that makes floors slippery. If you must use wax, use non-skid floor wax. Do not have throw rugs and other things on the  floor that can make you trip. What can I do with my stairs? Do not leave any items on the stairs. Make sure that there are handrails on both sides of the stairs and use them. Fix handrails that are broken or loose. Make sure that handrails are as long as the stairways. Check any carpeting to make sure that it is firmly attached to the stairs. Fix any carpet that is loose or worn. Avoid having throw rugs at the top or bottom of the stairs. If you do have throw rugs, attach them to the floor with carpet tape. Make sure that you have a light switch at the top of the stairs and the bottom of the stairs. If you do not have them, ask someone to add them for you. What else can I do to help prevent falls? Wear shoes that: Do not have high heels. Have rubber bottoms. Are comfortable and fit you well. Are closed at the toe. Do not wear sandals. If you use a stepladder: Make sure that it is fully  opened. Do not climb a closed stepladder. Make sure that both sides of the stepladder are locked into place. Ask someone to hold it for you, if possible. Clearly mark and make sure that you can see: Any grab bars or handrails. First and last steps. Where the edge of each step is. Use tools that help you move around (mobility aids) if they are needed. These include: Canes. Walkers. Scooters. Crutches. Turn on the lights when you go into a dark area. Replace any light bulbs as soon as they burn out. Set up your furniture so you have a clear path. Avoid moving your furniture around. If any of your floors are uneven, fix them. If there are any pets around you, be aware of where they are. Review your medicines with your doctor. Some medicines can make you feel dizzy. This can increase your chance of falling. Ask your doctor what other things that you can do to help prevent falls. This information is not intended to replace advice given to you by your health care provider. Make sure you discuss any questions you have with your health care provider. Document Released: 11/06/2008 Document Revised: 06/18/2015 Document Reviewed: 02/14/2014 Elsevier Interactive Patient Education  2017 [employer].

## 2022-05-05 NOTE — Progress Notes (Signed)
I connected with  Jason Mullins on 05/05/22 by a audio enabled telemedicine application and verified that I am speaking with the correct person using two identifiers.  Patient Location: Home  Provider Location: Office/Clinic  I discussed the limitations of evaluation and management by telemedicine. The patient expressed understanding and agreed to proceed.  Subjective:   Jason Mullins is a 72 y.o. male who presents for Medicare Annual/Subsequent preventive examination.  Review of Systems     Cardiac Risk Factors include: advanced age (>2355men, 50>65 women);hypertension;male gender     Objective:    Today's Vitals   05/05/22 1412 05/05/22 1414  Weight: 210 lb (95.3 kg)   Height: 6\' 4"  (1.93 m)   PainSc: 0-No pain 0-No pain  PainLoc: Shoulder    Body mass index is 25.56 kg/m.     05/05/2022    2:16 PM 04/23/2021    3:37 PM 03/16/2021   11:46 PM 07/08/2019    9:35 AM 07/05/2018    9:09 AM 06/30/2017   10:24 AM 06/21/2016    9:59 AM  Advanced Directives  Does Patient Have a Medical Advance Directive? Yes Yes No Yes Yes Yes Yes  Type of Estate agentAdvance Directive Healthcare Power of [place]orney;Living will Living will  Healthcare Power of [place]orney;Living will Healthcare Power of [place]orney;Living will Healthcare Power of [place]orney;Living will Healthcare Power of [place];Living will  Does patient want to make changes to medical advance directive?    No - Patient declined No - Patient declined    Copy of Healthcare Power of Attorney in Chart? No - copy requested   No - copy requested Yes - validated most recent copy scanned in chart (See row information) No - copy requested No - copy requested  Would patient like information on creating a medical advance directive?   No - Patient declined        Current Medications (verified) Outpatient Encounter Medications as of 05/05/2022  Medication Sig   amLODipine (NORVASC) 5 MG tablet Take 1 tablet (5 mg total) by mouth daily.   Blood Pressure Monitoring (B-D  ASSURE BPM/AUTO ARM CUFF) MISC 1 Device by Does not apply route daily as needed. Use to check BP as needed   hydrochlorothiazide (MICROZIDE) 12.5 MG capsule TAKE 1 CAPSULE BY MOUTH EVERY DAY   losartan (COZAAR) 100 MG tablet TAKE 1 TABLET BY MOUTH EVERY DAY   triamcinolone cream (KENALOG) 0.5 % Apply 1 Application topically 2 (two) times daily. Use as needed for eczema   [DISCONTINUED] meloxicam (MOBIC) 7.5 MG tablet TAKE 1 TABLET (7.5 MG TOTAL) BY MOUTH DAILY. CAN TAKE 2 DAILY IF NEEDED FOR UP TO 2 WEEKS FOR TMJ PAIN   [DISCONTINUED] sildenafil (REVATIO) 20 MG tablet Take 20- 100 mg daily as needed prior to intercourse   No facility-administered encounter medications on file as of 05/05/2022.    Allergies (verified) Patient has no known allergies.   History: Past Medical History:  Diagnosis Date   Asthma    History of hepatitis C    Cured   Hypertension    Past Surgical History:  Procedure Laterality Date   CORRECTION HAMMER TOE     Family History  Problem Relation Age of Onset   Cancer Mother    Cancer Father    Cancer Sister    Social History   Socioeconomic History   Marital status: Married    Spouse name: Jola BabinskiMarilyn   Number of children: 2   Years of education: Not on file   Highest education level:  Not on file  Occupational History   Not on file  Tobacco Use   Smoking status: Former    Types: Cigars   Smokeless tobacco: Never   Tobacco comments:    1 cigar a month  Vaping Use   Vaping Use: Never used  Substance and Sexual Activity   Alcohol use: Yes    Alcohol/week: 1.0 standard drink of alcohol    Types: 1 Cans of beer per week   Drug use: No   Sexual activity: Yes  Other Topics Concern   Not on file  Social History Narrative   Married in 1974.   2 sons.   4 grandsons.   Active in church. Goes to Gym 3x per week.    Social Determinants of Health   Financial Resource Strain: Low Risk  (05/05/2022)   Overall Financial Resource Strain (CARDIA)     Difficulty of Paying Living Expenses: Not hard at all  Food Insecurity: No Food Insecurity (05/05/2022)   Hunger Vital Sign    Worried About Running Out of Food in the Last Year: Never true    Ran Out of Food in the Last Year: Never true  Transportation Needs: No Transportation Needs (05/05/2022)   PRAPARE - Administrator, Civil Service (Medical): No    Lack of Transportation (Non-Medical): No  Physical Activity: Sufficiently Active (05/05/2022)   Exercise Vital Sign    Days of Exercise per Week: 3 days    Minutes of Exercise per Session: 50 min  Recent Concern: Physical Activity - Insufficiently Active (04/19/2022)   Exercise Vital Sign    Days of Exercise per Week: 3 days    Minutes of Exercise per Session: 40 min  Stress: No Stress Concern Present (05/05/2022)   [employer] of Occupational Health - Occupational Stress Questionnaire    Feeling of Stress : Not at all  Social Connections: Socially Integrated (05/05/2022)   Social Connection and Isolation Panel [NHANES]    Frequency of Communication with Friends and Family: More than three times a week    Frequency of Social Gatherings with Friends and Family: More than three times a week    Attends Religious Services: More than 4 times per year    Active Member of [employer] or Organizations: Yes    Attends Engineer, structural: More than 4 times per year    Marital Status: Married    Tobacco Counseling Counseling given: Not Answered Tobacco comments: 1 cigar a month   Clinical Intake:  Pre-visit preparation completed: Yes  Pain : No/denies pain Pain Score: 0-No pain     BMI - recorded: 25.56 Nutritional Status: BMI 25 -29 Overweight Nutritional Risks: None Diabetes: No  How often do you need to have someone help you when you read instructions, pamphlets, or other written materials from your doctor or pharmacy?: 1 - Never What is the last grade level you completed in school?: GRADUATE  SCHOOL  Diabetic? No  Interpreter Needed?: No  Information entered by :: Susie Cassette, LPN.   Activities of Daily Living    05/05/2022    2:27 PM 04/19/2022    9:16 AM  In your present state of health, do you have any difficulty performing the following activities:  Hearing? 0 0  Vision? 0 0  Difficulty concentrating or making decisions? 0 0  Walking or climbing stairs? 0 0  Dressing or bathing? 0 0  Doing errands, shopping? 0 0  Preparing Food and eating ? N N  Using the Toilet? N N  In the past six months, have you accidently leaked urine? N N  Do you have problems with loss of bowel control? N N  Managing your Medications? N N  Managing your Finances? N N  Housekeeping or managing your Housekeeping? N N    Patient Care Team: Copland, Gwenlyn Found, MD as PCP - General (Family Medicine) Teryl Lucy, MD as Consulting Physician (Orthopedic Surgery) Manuela Schwartz, DDS as Consulting Physician (Dentistry) Copland, Gwenlyn Found, MD as Consulting Physician (Family Medicine) Marty Heck, OD as Consulting Physician (Optometry)  Indicate any recent Medical Services you may have received from other than [facility] providers in the past year (date may be approximate).     Assessment:   This is a routine wellness examination for [place].  Hearing/Vision screen Hearing Screening - Comments:: Denies hearing difficulties. No hearing aids.   Vision Screening - Comments:: Wears rx glasses - up to date with routine eye exams with [facility]   Dietary issues and exercise activities discussed: Current Exercise Habits: Structured exercise class ([facility] [employer]ERS AT [facility]), Type of exercise: walking;treadmill;stretching;strength training/weights;exercise ball, Time (Minutes): 45, Frequency (Times/Week): 3, Weekly Exercise (Minutes/Week): 135, Intensity: Moderate, Exercise limited by: None identified   Goals Addressed             This Visit's Progress    My goal  is to decrease my shoulder pain.        Depression Screen    05/05/2022    2:28 PM 04/23/2021    3:35 PM 03/22/2021   11:39 AM 07/18/2019   10:02 AM 07/08/2019    9:38 AM 07/05/2018    9:10 AM 06/30/2017   10:25 AM  PHQ 2/9 Scores  PHQ - 2 Score 0 0 0 0 0 0 0  PHQ- 9 Score 0          Fall Risk    05/05/2022    2:29 PM 04/19/2022    9:16 AM 04/23/2021    3:38 PM 03/22/2021   11:39 AM 07/18/2019   10:02 AM  Fall Risk   Falls in the past year? 0 0 0 0 0  Number falls in past yr: 0  0 0   Injury with Fall? 0  0 0   Risk for fall due to : No Fall Risks  No Fall Risks    Follow up Falls prevention discussed  Falls prevention discussed      FALL RISK PREVENTION PERTAINING TO THE HOME:  Any stairs in or around the home? No  If so, are there any without handrails? No  Home free of loose throw rugs in walkways, pet beds, electrical cords, etc? Yes  Adequate lighting in your home to reduce risk of falls? Yes   ASSISTIVE DEVICES UTILIZED TO PREVENT FALLS:  Life alert? No  Use of a cane, walker or w/c? No  Grab bars in the bathroom? Yes  Shower chair or bench in shower? No  Elevated toilet seat or a handicapped toilet? Yes   TIMED UP AND GO:  Was the test performed? No . Telephonic Visit  Cognitive Function:    06/21/2016    9:59 AM  MMSE - Mini Mental State Exam  Orientation to time 4  Orientation to Place 5  Registration 3  Attention/ Calculation 5  Recall 3  Language- name 2 objects 2  Language- repeat 1  Language- follow 3 step command 3  Language- read & follow direction 1  Write  a sentence 1  Copy design 1  Total score 29        05/05/2022    2:22 PM 04/23/2021    3:39 PM  6CIT Screen  What Year? 0 points 0 points  What month? 0 points 0 points  What time? 0 points 0 points  Count back from 20 0 points 0 points  Months in reverse 0 points 0 points  Repeat phrase 0 points 0 points  Total Score 0 points 0 points    Immunizations Immunization History   Administered Date(s) Administered   Hepatitis A, Adult 11/05/2014   Hepatitis B, ADULT 11/05/2014   Janssen (J&J) SARS-COV-2 Vaccination 03/25/2019   Pneumococcal Conjugate-13 05/06/2015   Td 03/22/2021   Tdap 01/24/2010    TDAP status: Up to date  Flu Vaccine status: Declined, Education has been provided regarding the importance of this vaccine but patient still declined. Advised may receive this vaccine at local pharmacy or Health Dept. Aware to provide a copy of the vaccination record if obtained from local pharmacy or Health Dept. Verbalized acceptance and understanding.  Pneumococcal vaccine status: Declined,  Education has been provided regarding the importance of this vaccine but patient still declined. Advised may receive this vaccine at local pharmacy or Health Dept. Aware to provide a copy of the vaccination record if obtained from local pharmacy or Health Dept. Verbalized acceptance and understanding.   Covid-19 vaccine status: Declined, Education has been provided regarding the importance of this vaccine but patient still declined. Advised may receive this vaccine at local pharmacy or Health Dept.or vaccine clinic. Aware to provide a copy of the vaccination record if obtained from local pharmacy or Health Dept. Verbalized acceptance and understanding.  Qualifies for Shingles Vaccine? Yes   Zostavax completed No   Shingrix Completed?: No.    Education has been provided regarding the importance of this vaccine. Patient has been advised to call insurance company to determine out of pocket expense if they have not yet received this vaccine. Advised may also receive vaccine at local pharmacy or Health Dept. Verbalized acceptance and understanding.  Screening Tests Health Maintenance  Topic Date Due   COLONOSCOPY (Pts 45-64yrs Insurance coverage will need to be confirmed)  01/25/2023   Medicare Annual Wellness (AWV)  05/05/2023   DTaP/Tdap/Td (3 - Td or Tdap) 03/23/2031   Hepatitis  C Screening  Completed   HPV VACCINES  Aged Out   Pneumonia Vaccine 40+ Years old  Discontinued   INFLUENZA VACCINE  Discontinued   COVID-19 Vaccine  Discontinued   Zoster Vaccines- Shingrix  Discontinued    Health Maintenance  There are no preventive care reminders to display for this patient.   Colorectal cancer screening: Type of screening: Colonoscopy. Completed 01/24/2013. Repeat every 10 years  Lung Cancer Screening: (Low Dose CT Chest recommended if Age 20-80 years, 30 pack-year currently smoking OR have quit w/in 15years.) does not qualify.   Lung Cancer Screening Referral: no  Additional Screening:  Hepatitis C Screening: does qualify; Completed 08/30/2016  Vision Screening: Recommended annual ophthalmology exams for early detection of glaucoma and other disorders of the eye. Is the patient up to date with their annual eye exam?  Yes  Who is the provider or what is the name of the office in which the patient attends annual eye exams? [facility]  If pt is not established with a provider, would they like to be referred to a provider to establish care? No .   Dental Screening: Recommended  annual dental exams for proper oral hygiene  Community Resource Referral / Chronic Care Management: CRR required this visit?  No   CCM required this visit?  No      Plan:     I have personally reviewed and noted the following in the patient's chart:   Medical and social history Use of alcohol, tobacco or illicit drugs  Current medications and supplements including opioid prescriptions. Patient is not currently taking opioid prescriptions. Functional ability and status Nutritional status Physical activity Advanced directives List of other physicians Hospitalizations, surgeries, and ER visits in previous 12 months Vitals Screenings to include cognitive, depression, and falls Referrals and appointments  In addition, I have reviewed and discussed with patient certain  preventive protocols, quality metrics, and best practice recommendations. A written personalized care plan for preventive services as well as general preventive health recommendations were provided to patient.     Mickeal Needy, LPN   9/60/4540   Nurse Notes: Normal cognitive status assessed by direct observation via phone by this Nurse Health Advisor. No abnormalities found.   Patient Medicare AWV questionnaire was completed by the patient on 04/19/2022; I have confirmed that all information answered by patient is correct and no changes since this date.

## 2022-07-05 ENCOUNTER — Other Ambulatory Visit: Payer: Self-pay | Admitting: Family Medicine

## 2022-07-15 ENCOUNTER — Telehealth: Payer: Self-pay

## 2022-07-15 ENCOUNTER — Encounter: Payer: Self-pay | Admitting: Family Medicine

## 2022-07-15 MED ORDER — LOSARTAN POTASSIUM 100 MG PO TABS: 100.0000 mg | ORAL_TABLET | Freq: Every day | ORAL | 1 refills | Status: DC

## 2022-07-15 NOTE — Addendum Note (Signed)
Addended by: Pearline Cables on: 07/15/2022 06:49 PM   Modules accepted: Orders

## 2022-07-15 NOTE — Telephone Encounter (Signed)
Pt called stating that he needed a refill of his [facility] following [facility] as he is currently out and in another state:  [facility] [address] [place], [place], [place] [address] P: [phone]  Pt stated that he is set to return mid-September.

## 2022-07-15 NOTE — Telephone Encounter (Signed)
error 

## 2022-07-15 NOTE — Telephone Encounter (Signed)
Pt called asking for a refill of Losartan 100 mg  Last OV was 01/03/22. Pt says he is in [place] until September. He says when he is back he will make his f/u.   [facility] [address] [place], [place] [address] Phone: [phone]

## 2022-07-18 NOTE — Telephone Encounter (Signed)
Rx was refilled : 07/15/22  6:49 PM Note Addended by: Abbe Amsterdam C on: 07/15/2022 06:49 PM     Modules accepted: Orders

## 2022-09-20 DIAGNOSIS — U071 COVID-19: Secondary | ICD-10-CM | POA: Diagnosis not present

## 2022-10-12 ENCOUNTER — Encounter: Payer: Self-pay | Admitting: Pharmacist

## 2022-10-18 NOTE — Progress Notes (Unsigned)
[facility] [address] [place], [place] [address] [phone]  Date:  10/19/2022   Name:  Jason Mullins   DOB:  [birth year]   MRN:  [id number]  PCP:  Pearline Cables, MD    Chief Complaint: No chief complaint on file.   History of Present Illness:  Jason Mullins is a 72 y.o. 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   Benign essential HTN 01/30/2014   Hepatitis C antibody test positive 01/30/2014   History of Sleep apnea 01/30/2014    Past Medical History:  Diagnosis Date   Asthma    History of hepatitis C    Cured   Hypertension     Past Surgical History:  Procedure Laterality Date   CORRECTION HAMMER TOE      Social History   Tobacco Use   Smoking status: Former    Types: Cigars   Smokeless tobacco: Never   Tobacco comments:    1 cigar a month  Vaping Use   Vaping status: Never Used  Substance Use Topics   Alcohol use: Yes    Alcohol/week: 1.0 standard drink of alcohol    Types: 1 Cans of beer per week   Drug use: No    Family History  Problem Relation Age of Onset   Cancer Mother    Cancer Father    Cancer Sister     No Known Allergies  Medication list has been reviewed and updated.  Current Outpatient Medications on File Prior to Visit  Medication Sig Dispense Refill   amLODipine (NORVASC) 5 MG tablet Take 1 tablet (5 mg total) by mouth daily. 90 tablet 3   Blood Pressure Monitoring (B-D ASSURE BPM/AUTO ARM CUFF) MISC 1 Device by Does  not apply route daily as needed. Use to check BP as needed 1 each 0   hydrochlorothiazide (MICROZIDE) 12.5 MG capsule TAKE 1 CAPSULE BY MOUTH EVERY DAY 90 capsule 1   losartan (COZAAR) 100 MG tablet Take 1 tablet (100 mg total) by mouth daily. 90 tablet 1   triamcinolone cream (KENALOG) 0.5 % Apply 1 Application topically 2 (two) times daily. Use as needed for eczema 90 g 2   No current facility-administered medications on file prior to visit.    Review of Systems:  As per HPI- otherwise negative.   Physical Examination: There were no vitals filed for this visit. There were no vitals filed for this visit. There is no height or weight on file to calculate BMI. Ideal Body Weight:    GEN: no acute distress. HEENT: Atraumatic, Normocephalic.  Ears and Nose: No external deformity. CV: RRR, No M/G/R. No JVD. No thrill. No extra heart sounds. PULM: CTA B, no wheezes, crackles, rhonchi. No retractions. No resp. distress. No accessory muscle use. ABD: S, NT, ND, +BS. No rebound. No HSM. EXTR: No c/c/e PSYCH: Normally interactive. Conversant.    Assessment and Plan: *** Patient seen today for a wellness visit Encouraged healthy diet and exercise routine Signed Shanda Bumps  Timotheus Salm, MD

## 2022-10-18 NOTE — Patient Instructions (Incomplete)
It was good to see you again today, I will be in touch with your lab work Recommend COVID booster this fall, shingles vaccine series if not already completed

## 2022-10-19 ENCOUNTER — Encounter: Payer: Self-pay | Admitting: Family Medicine

## 2022-10-19 ENCOUNTER — Ambulatory Visit: Payer: Medicare Other | Admitting: Family Medicine

## 2022-10-19 ENCOUNTER — Ambulatory Visit (HOSPITAL_BASED_OUTPATIENT_CLINIC_OR_DEPARTMENT_OTHER)
Admission: RE | Admit: 2022-10-19 | Discharge: 2022-10-19 | Disposition: A | Discharge status: Home / Self Care | Payer: Medicare Other | Source: Ambulatory Visit | Attending: Family Medicine | Admitting: Family Medicine

## 2022-10-19 VITALS — BP 112/60 | HR 52 | Temp 97.6°F | Resp 18 | Ht 76.0 in | Wt 196.0 lb

## 2022-10-19 DIAGNOSIS — R972 Elevated prostate specific antigen [PSA]: Secondary | ICD-10-CM

## 2022-10-19 DIAGNOSIS — R634 Abnormal weight loss: Secondary | ICD-10-CM | POA: Insufficient documentation

## 2022-10-19 DIAGNOSIS — Z87891 Personal history of nicotine dependence: Secondary | ICD-10-CM | POA: Diagnosis not present

## 2022-10-19 DIAGNOSIS — Z8619 Personal history of other infectious and parasitic diseases: Secondary | ICD-10-CM

## 2022-10-19 DIAGNOSIS — Z13220 Encounter for screening for lipoid disorders: Secondary | ICD-10-CM | POA: Diagnosis not present

## 2022-10-19 DIAGNOSIS — Z125 Encounter for screening for malignant neoplasm of prostate: Secondary | ICD-10-CM | POA: Diagnosis not present

## 2022-10-19 DIAGNOSIS — Z131 Encounter for screening for diabetes mellitus: Secondary | ICD-10-CM | POA: Diagnosis not present

## 2022-10-19 DIAGNOSIS — I10 Essential (primary) hypertension: Secondary | ICD-10-CM

## 2022-10-19 DIAGNOSIS — I499 Cardiac arrhythmia, unspecified: Secondary | ICD-10-CM | POA: Diagnosis not present

## 2022-10-19 LAB — COMPREHENSIVE METABOLIC PANEL
ALT: 13 U/L (ref 0–53)
AST: 18 U/L (ref 0–37)
Albumin: 4.3 g/dL (ref 3.5–5.2)
Alkaline Phosphatase: 62 U/L (ref 39–117)
BUN: 17 mg/dL (ref 6–23)
CO2: 29 mEq/L (ref 19–32)
Calcium: 10.2 mg/dL (ref 8.4–10.5)
Chloride: 102 mEq/L (ref 96–112)
Creatinine, Ser: 0.9 mg/dL (ref 0.40–1.50)
GFR: 84.92 mL/min (ref >60.00)
Glucose, Bld: 85 mg/dL (ref 70–99)
Potassium: 4.5 mEq/L (ref 3.5–5.1)
Sodium: 139 mEq/L (ref 135–145)
Total Bilirubin: 0.7 mg/dL (ref 0.2–1.2)
Total Protein: 7.3 g/dL (ref 6.0–8.3)

## 2022-10-19 LAB — CBC
HCT: 43.8 % (ref 39.0–52.0)
Hemoglobin: 14.3 g/dL (ref 13.0–17.0)
MCHC: 32.7 g/dL (ref 30.0–36.0)
MCV: 93.2 fl (ref 78.0–100.0)
Platelets: 184 10*3/uL (ref 150.0–400.0)
RBC: 4.69 Mil/uL (ref 4.22–5.81)
RDW: 14.1 % (ref 11.5–15.5)
WBC: 3.9 10*3/uL — ABNORMAL LOW (ref 4.0–10.5)

## 2022-10-19 LAB — LIPID PANEL
Cholesterol: 190 mg/dL (ref 0–200)
HDL: 74.5 mg/dL (ref >39.00)
LDL Cholesterol: 105 mg/dL — ABNORMAL HIGH (ref 0–99)
NonHDL: 115.98
Total CHOL/HDL Ratio: 3
Triglycerides: 56 mg/dL (ref 0.0–149.0)
VLDL: 11.2 mg/dL (ref 0.0–40.0)

## 2022-10-19 LAB — HEMOGLOBIN A1C: Hgb A1c MFr Bld: 5.5 % (ref 4.6–6.5)

## 2022-10-19 LAB — PSA, MEDICARE: PSA: 1.19 ng/ml (ref 0.10–4.00)

## 2022-10-30 ENCOUNTER — Other Ambulatory Visit: Payer: Self-pay | Admitting: Family Medicine

## 2022-11-03 ENCOUNTER — Ambulatory Visit (HOSPITAL_BASED_OUTPATIENT_CLINIC_OR_DEPARTMENT_OTHER): Payer: Medicare Other

## 2022-11-22 ENCOUNTER — Telehealth (HOSPITAL_BASED_OUTPATIENT_CLINIC_OR_DEPARTMENT_OTHER): Payer: Self-pay

## 2022-12-06 ENCOUNTER — Other Ambulatory Visit: Payer: Self-pay | Admitting: Family Medicine

## 2022-12-06 DIAGNOSIS — I10 Essential (primary) hypertension: Secondary | ICD-10-CM

## 2023-01-24 ENCOUNTER — Other Ambulatory Visit: Payer: Self-pay | Admitting: Family Medicine

## 2023-04-17 NOTE — Progress Notes (Deleted)
 [facility] [address], [address] [place], [place] [address] [phone]  Date:  04/19/2023   Name:  Jason Mullins   DOB:  14-Aug-1950   MRN:  [id number]  PCP:  Jason Cables, MD    Chief Complaint: No chief complaint on file.   History of Present Illness:  Jason Mullins is a 73 y.o. 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   Benign essential HTN 01/30/2014   Hepatitis C antibody test positive 01/30/2014   History of Sleep apnea 01/30/2014    Past Medical History:  Diagnosis Date   Asthma    History of hepatitis C    Cured   Hypertension     Past Surgical History:  Procedure Laterality Date   CORRECTION HAMMER TOE      Social History   Tobacco Use   Smoking status: Former    Types: Cigars   Smokeless tobacco: Never   Tobacco comments:    1 cigar a month  Vaping Use   Vaping status: Never Used  Substance Use Topics   Alcohol use: Yes    Alcohol/week: 1.0 standard drink of alcohol    Types: 1 Cans of beer per week   Drug use: No    Family History  Problem Relation Age of Onset   Cancer Mother    Cancer Father    Cancer Sister     No Known Allergies  Medication list has been reviewed and updated.  Current Outpatient Medications on File Prior to Visit  Medication Sig Dispense Refill   amLODipine (NORVASC) 5 MG tablet TAKE 1 TABLET (5 MG TOTAL) BY MOUTH DAILY. 90 tablet 3   Blood Pressure Monitoring (B-D  ASSURE BPM/AUTO ARM CUFF) MISC 1 Device by Does not apply route daily as needed. Use to check BP as needed 1 each 0   hydrochlorothiazide (MICROZIDE) 12.5 MG capsule Take 1 capsule (12.5 mg total) by mouth daily. 90 capsule 1   losartan (COZAAR) 100 MG tablet Take 1 tablet (100 mg total) by mouth daily. 90 tablet 1   triamcinolone cream (KENALOG) 0.5 % Apply 1 Application topically 2 (two) times daily. Use as needed for eczema 90 g 2   No current facility-administered medications on file prior to visit.    Review of Systems:  As per HPI- otherwise negative.   Physical Examination: There were no vitals filed for this visit. There were no vitals filed for this visit. There is no height or weight on file to calculate BMI. Ideal Body Weight:    GEN: no acute distress. HEENT: Atraumatic, Normocephalic.  Ears and Nose: No external deformity. CV: RRR, No M/G/R. No JVD. No thrill. No extra heart sounds. PULM: CTA B, no wheezes, crackles, rhonchi. No retractions. No resp. distress. No accessory muscle use. ABD: S, NT, ND, +BS. No rebound. No HSM. EXTR: No c/c/e PSYCH: Normally interactive. Conversant.    Assessment  and Plan: ***  Signed Abbe Amsterdam, MD

## 2023-04-19 ENCOUNTER — Ambulatory Visit: Payer: Medicare Other | Admitting: Family Medicine

## 2023-04-19 DIAGNOSIS — Z1330 Encounter for screening examination for mental health and behavioral disorders, unspecified: Secondary | ICD-10-CM | POA: Diagnosis not present

## 2023-04-19 DIAGNOSIS — M7552 Bursitis of left shoulder: Secondary | ICD-10-CM | POA: Diagnosis not present

## 2023-04-19 DIAGNOSIS — M25512 Pain in left shoulder: Secondary | ICD-10-CM | POA: Diagnosis not present

## 2023-04-19 DIAGNOSIS — M25511 Pain in right shoulder: Secondary | ICD-10-CM | POA: Diagnosis not present

## 2023-04-19 DIAGNOSIS — I10 Essential (primary) hypertension: Secondary | ICD-10-CM

## 2023-05-18 ENCOUNTER — Ambulatory Visit: Payer: Medicare Other

## 2023-05-18 VITALS — Ht 76.0 in | Wt 196.0 lb

## 2023-05-18 DIAGNOSIS — Z1211 Encounter for screening for malignant neoplasm of colon: Secondary | ICD-10-CM

## 2023-05-18 DIAGNOSIS — Z0000 Encounter for general adult medical examination without abnormal findings: Secondary | ICD-10-CM

## 2023-05-18 NOTE — Progress Notes (Signed)
 Subjective:   Jason Mullins is a 73 y.o. who presents for a Medicare Wellness preventive visit.  Visit Complete: Virtual I connected with  [name] on 05/18/23 by a audio enabled telemedicine application and verified that I am speaking with the correct person using two identifiers.  Patient Location: Home  Provider Location: Home Office  I discussed the limitations of evaluation and management by telemedicine. The patient expressed understanding and agreed to proceed.  Vital Signs: Because this visit was a virtual/telehealth visit, some criteria may be missing or patient reported. Any vitals not documented were not able to be obtained and vitals that have been documented are patient reported.    Persons Participating in Visit: Patient.  AWV Questionnaire: No: Patient Medicare AWV questionnaire was not completed prior to this visit.  Cardiac Risk Factors include: advanced age (>15men, >27 women);male gender;hypertension     Objective:    Today's Vitals   05/18/23 1130  Weight: 196 lb (88.9 kg)  Height: 6\' 4"  (1.93 m)   Body mass index is 23.86 kg/m.     05/18/2023   11:37 AM 05/05/2022    2:16 PM 04/23/2021    3:37 PM 03/16/2021   11:46 PM 07/08/2019    9:35 AM 07/05/2018    9:09 AM 06/30/2017   10:24 AM  Advanced Directives  Does Patient Have a Medical Advance Directive? Yes Yes Yes No Yes Yes Yes  Type of Estate agent of [place];Living will Healthcare Power of [place];Living will Living will  Healthcare Power of [place];Living will Healthcare Power of [place];Living will Healthcare Power of [place];Living will  Does patient want to make changes to medical advance directive?     No - Patient declined No - Patient declined   Copy of Healthcare Power of Attorney in Chart? No - copy requested No - copy requested   No - copy requested Yes - validated most recent copy scanned in chart (See row information) No - copy requested  Would patient like  information on creating a medical advance directive?    No - Patient declined       Current Medications (verified) Outpatient Encounter Medications as of 05/18/2023  Medication Sig   amLODipine  (NORVASC ) 5 MG tablet TAKE 1 TABLET (5 MG TOTAL) BY MOUTH DAILY. (Patient not taking: Reported on 05/18/2023)   Blood Pressure Monitoring (B-D ASSURE BPM/AUTO ARM CUFF) MISC 1 Device by Does not apply route daily as needed. Use to check BP as needed   hydrochlorothiazide  (MICROZIDE ) 12.5 MG capsule Take 1 capsule (12.5 mg total) by mouth daily. (Patient not taking: Reported on 05/18/2023)   losartan  (COZAAR ) 100 MG tablet Take 1 tablet (100 mg total) by mouth daily.   triamcinolone  cream (KENALOG ) 0.5 % Apply 1 Application topically 2 (two) times daily. Use as needed for eczema   No facility-administered encounter medications on file as of 05/18/2023.    Allergies (verified) Patient has no known allergies.   History: Past Medical History:  Diagnosis Date   Asthma    History of hepatitis C    Cured   Hypertension    Past Surgical History:  Procedure Laterality Date   CORRECTION HAMMER TOE     Family History  Problem Relation Age of Onset   Cancer Mother    Cancer Father    Cancer Sister    Social History   Socioeconomic History   Marital status: Married    Spouse name: Trenia Fritter   Number of children: 2   Years  of education: Not on file   Highest education level: Not on file  Occupational History   Not on file  Tobacco Use   Smoking status: Former    Types: Cigars   Smokeless tobacco: Never   Tobacco comments:    1 cigar a month  Vaping Use   Vaping status: Never Used  Substance and Sexual Activity   Alcohol use: Yes    Alcohol/week: 1.0 standard drink of alcohol    Types: 1 Cans of beer per week   Drug use: No   Sexual activity: Yes  Other Topics Concern   Not on file  Social History Narrative   Married in 1974.   2 sons.   4 grandsons.   Active in church. Goes to Gym  3x per week.    Social Drivers of Corporate investment banker Strain: Low Risk  (05/18/2023)   Overall Financial Resource Strain (CARDIA)    Difficulty of Paying Living Expenses: Not hard at all  Food Insecurity: No Food Insecurity (05/18/2023)   Hunger Vital Sign    Worried About Running Out of Food in the Last Year: Never true    Ran Out of Food in the Last Year: Never true  Transportation Needs: No Transportation Needs (05/18/2023)   PRAPARE - Administrator, Civil Service (Medical): No    Lack of Transportation (Non-Medical): No  Physical Activity: Insufficiently Active (05/18/2023)   Exercise Vital Sign    Days of Exercise per Week: 3 days    Minutes of Exercise per Session: 40 min  Stress: No Stress Concern Present (05/18/2023)   [employer] of Occupational Health - Occupational Stress Questionnaire    Feeling of Stress : Not at all  Social Connections: Socially Integrated (05/18/2023)   Social Connection and Isolation Panel [NHANES]    Frequency of Communication with Friends and Family: More than three times a week    Frequency of Social Gatherings with Friends and Family: More than three times a week    Attends Religious Services: More than 4 times per year    Active Member of [employer] or Organizations: Yes    Attends Engineer, structural: More than 4 times per year    Marital Status: Married    Tobacco Counseling Counseling given: Not Answered Tobacco comments: 1 cigar a month    Clinical Intake:  Pre-visit preparation completed: Yes  Pain : No/denies pain     BMI - recorded: 23.86 Nutritional Status: BMI of 19-24  Normal Nutritional Risks: None Diabetes: No  Lab Results  Component Value Date   HGBA1C 5.5 10/19/2022     How often do you need to have someone help you when you read instructions, pamphlets, or other written materials from your doctor or pharmacy?: 1 - Never  Interpreter Needed?: No  Information entered by :: Farris Hong LPN   Activities of Daily Living     05/18/2023   11:36 AM  In your present state of health, do you have any difficulty performing the following activities:  Hearing? 0  Vision? 0  Difficulty concentrating or making decisions? 0  Walking or climbing stairs? 0  Dressing or bathing? 0  Doing errands, shopping? 0  Preparing Food and eating ? N  Using the Toilet? N  In the past six months, have you accidently leaked urine? N  Do you have problems with loss of bowel control? N  Managing your Medications? N  Managing your Finances? N  Housekeeping  or managing your Housekeeping? N    Patient Care Team: Copland, Skipper Dumas, MD as PCP - General (Family Medicine) Osa Blase, MD as Consulting Physician (Orthopedic Surgery) Ballard Levels, DDS as Consulting Physician (Dentistry) Haroldine Likens, OD as Consulting Physician (Optometry)  Indicate any recent Medical Services you may have received from other than [facility] providers in the past year (date may be approximate).     Assessment:   This is a routine wellness examination for Jori.  Hearing/Vision screen Hearing Screening - Comments:: Denies hearing difficulties   Vision Screening - Comments:: Wears rx glasses - up to date with routine eye exams with  [facility]   Goals Addressed               This Visit's Progress     Increase physical activity (pt-stated)        Remain active.       Depression Screen     05/18/2023   11:36 AM 10/19/2022    9:29 AM 05/05/2022    2:28 PM 04/23/2021    3:35 PM 03/22/2021   11:39 AM 07/18/2019   10:02 AM 07/08/2019    9:38 AM  PHQ 2/9 Scores  PHQ - 2 Score 0 0 0 0 0 0 0  PHQ- 9 Score   0        Fall Risk     05/18/2023   11:36 AM 10/19/2022    9:28 AM 05/05/2022    2:29 PM 04/19/2022    9:16 AM 04/23/2021    3:38 PM  Fall Risk   Falls in the past year? 0 0 0 0 0  Number falls in past yr: 0 0 0  0  Injury with Fall? 0 0 0  0  Risk for fall due to : No Fall  Risks No Fall Risks No Fall Risks  No Fall Risks  Follow up Falls prevention discussed;Falls evaluation completed Falls evaluation completed Falls prevention discussed  Falls prevention discussed    MEDICARE RISK AT HOME:  Medicare Risk at Home Any stairs in or around the home?: No If so, are there any without handrails?: No Home free of loose throw rugs in walkways, pet beds, electrical cords, etc?: Yes Adequate lighting in your home to reduce risk of falls?: Yes Life alert?: No Use of a cane, walker or w/c?: No Grab bars in the bathroom?: Yes Shower chair or bench in shower?: No Elevated toilet seat or a handicapped toilet?: Yes  TIMED UP AND GO:  Was the test performed?  No  Cognitive Function: 6CIT completed    06/21/2016    9:59 AM  MMSE - Mini Mental State Exam  Orientation to time 4  Orientation to Place 5  Registration 3  Attention/ Calculation 5  Recall 3  Language- name 2 objects 2  Language- repeat 1  Language- follow 3 step command 3  Language- read & follow direction 1  Write a sentence 1  Copy design 1  Total score 29        05/18/2023   11:37 AM 05/05/2022    2:22 PM 04/23/2021    3:39 PM  6CIT Screen  What Year? 0 points 0 points 0 points  What month? 0 points 0 points 0 points  What time? 0 points 0 points 0 points  Count back from 20 0 points 0 points 0 points  Months in reverse 0 points 0 points 0 points  Repeat phrase 0 points 0 points 0  points  Total Score 0 points 0 points 0 points    Immunizations Immunization History  Administered Date(s) Administered   Hepatitis A, Adult 11/05/2014   Hepatitis B, ADULT 11/05/2014   Janssen (J&J) SARS-COV-2 Vaccination 03/25/2019   Pneumococcal Conjugate-13 05/06/2015   Td 03/22/2021   Tdap 01/24/2010    Screening Tests Health Maintenance  Topic Date Due   Colonoscopy  01/25/2023   Medicare Annual Wellness (AWV)  05/17/2024   DTaP/Tdap/Td (3 - Td or Tdap) 03/23/2031   Hepatitis C Screening   Completed   HPV VACCINES  Aged Out   Meningococcal B Vaccine  Aged Out   Pneumonia Vaccine 28+ Years old  Discontinued   INFLUENZA VACCINE  Discontinued   COVID-19 Vaccine  Discontinued   Zoster Vaccines- Shingrix  Discontinued    Health Maintenance  Health Maintenance Due  Topic Date Due   Colonoscopy  01/25/2023   Health Maintenance Items Addressed: Referral sent to GI for colonoscopy  Additional Screening:  Vision Screening: Recommended annual ophthalmology exams for early detection of glaucoma and other disorders of the eye.  Dental Screening: Recommended annual dental exams for proper oral hygiene  Community Resource Referral / Chronic Care Management: CRR required this visit?  No   CCM required this visit?  No     Plan:     I have personally reviewed and noted the following in the patient's chart:   Medical and social history Use of alcohol, tobacco or illicit drugs  Current medications and supplements including opioid prescriptions. Patient is not currently taking opioid prescriptions. Functional ability and status Nutritional status Physical activity Advanced directives List of other physicians Hospitalizations, surgeries, and ER visits in previous 12 months Vitals Screenings to include cognitive, depression, and falls Referrals and appointments  In addition, I have reviewed and discussed with patient certain preventive protocols, quality metrics, and best practice recommendations. A written personalized care plan for preventive services as well as general preventive health recommendations were provided to patient.     Dewayne Ford, LPN   6/96/2952   After Visit Summary: (MyChart) Due to this being a telephonic visit, the after visit summary with patients personalized plan was offered to patient via MyChart   Notes: Nothing significant to report at this time.

## 2023-05-18 NOTE — Patient Instructions (Addendum)
 Jason Mullins , Thank you for taking time to come for your Medicare Wellness Visit. I appreciate your ongoing commitment to your health goals. Please review the following plan we discussed and let me know if I can assist you in the future.   Referrals/Orders/Follow-Ups/Clinician Recommendations:    This is a list of the screening recommended for you and due dates:  Health Maintenance  Topic Date Due   Colon Cancer Screening  01/25/2023   Medicare Annual Wellness Visit  05/17/2024   DTaP/Tdap/Td vaccine (3 - Td or Tdap) 03/23/2031   Hepatitis C Screening  Completed   HPV Vaccine  Aged Out   Meningitis B Vaccine  Aged Out   Pneumonia Vaccine  Discontinued   Flu Shot  Discontinued   COVID-19 Vaccine  Discontinued   Zoster (Shingles) Vaccine  Discontinued    Advanced directives: (Copy Requested) Please bring a copy of your health care power of attorney and living will to the office to be added to your chart at your convenience. You can mail to [facility] [address] [place], [place] [address] or email to [email or web address]  Next Medicare Annual Wellness Visit scheduled for next year: Yes

## 2023-06-15 DIAGNOSIS — H40002 Preglaucoma, unspecified, left eye: Secondary | ICD-10-CM | POA: Diagnosis not present

## 2023-06-15 DIAGNOSIS — H04123 Dry eye syndrome of bilateral lacrimal glands: Secondary | ICD-10-CM | POA: Diagnosis not present

## 2023-06-15 DIAGNOSIS — H2513 Age-related nuclear cataract, bilateral: Secondary | ICD-10-CM | POA: Diagnosis not present

## 2023-08-22 ENCOUNTER — Other Ambulatory Visit: Payer: Self-pay | Admitting: Family Medicine

## 2023-09-14 ENCOUNTER — Encounter: Payer: Self-pay | Admitting: Family Medicine

## 2023-10-12 ENCOUNTER — Emergency Department (HOSPITAL_BASED_OUTPATIENT_CLINIC_OR_DEPARTMENT_OTHER)

## 2023-10-12 ENCOUNTER — Other Ambulatory Visit: Payer: Self-pay

## 2023-10-12 ENCOUNTER — Emergency Department (HOSPITAL_BASED_OUTPATIENT_CLINIC_OR_DEPARTMENT_OTHER)
Admission: EM | Admit: 2023-10-12 | Discharge: 2023-10-12 | Disposition: A | Discharge status: Home / Self Care | Attending: Emergency Medicine | Admitting: Emergency Medicine

## 2023-10-12 ENCOUNTER — Other Ambulatory Visit (HOSPITAL_BASED_OUTPATIENT_CLINIC_OR_DEPARTMENT_OTHER): Payer: Self-pay

## 2023-10-12 ENCOUNTER — Encounter (HOSPITAL_BASED_OUTPATIENT_CLINIC_OR_DEPARTMENT_OTHER): Payer: Self-pay

## 2023-10-12 DIAGNOSIS — S82421A Displaced transverse fracture of shaft of right fibula, initial encounter for closed fracture: Secondary | ICD-10-CM | POA: Diagnosis not present

## 2023-10-12 DIAGNOSIS — I10 Essential (primary) hypertension: Secondary | ICD-10-CM | POA: Insufficient documentation

## 2023-10-12 DIAGNOSIS — X501XXA Overexertion from prolonged static or awkward postures, initial encounter: Secondary | ICD-10-CM | POA: Diagnosis not present

## 2023-10-12 DIAGNOSIS — Z79899 Other long term (current) drug therapy: Secondary | ICD-10-CM | POA: Insufficient documentation

## 2023-10-12 DIAGNOSIS — J45909 Unspecified asthma, uncomplicated: Secondary | ICD-10-CM | POA: Diagnosis not present

## 2023-10-12 DIAGNOSIS — S82851A Displaced trimalleolar fracture of right lower leg, initial encounter for closed fracture: Secondary | ICD-10-CM | POA: Diagnosis not present

## 2023-10-12 DIAGNOSIS — S99911A Unspecified injury of right ankle, initial encounter: Secondary | ICD-10-CM | POA: Diagnosis present

## 2023-10-12 DIAGNOSIS — S8251XA Displaced fracture of medial malleolus of right tibia, initial encounter for closed fracture: Secondary | ICD-10-CM | POA: Diagnosis not present

## 2023-10-12 MED ORDER — FENTANYL CITRATE PF 50 MCG/ML IJ SOSY
50.0000 ug | PREFILLED_SYRINGE | Freq: Once | INTRAMUSCULAR | Status: AC
  Administered 2023-10-12: 50 ug via INTRAVENOUS
  Filled 2023-10-12: qty 1

## 2023-10-12 MED ORDER — OXYCODONE HCL 5 MG PO TABS
5.0000 mg | ORAL_TABLET | Freq: Four times a day (QID) | ORAL | 0 refills | Status: DC | PRN
  Filled 2023-10-12: qty 10, 3d supply, fill #0

## 2023-10-12 MED ORDER — SODIUM CHLORIDE 0.9 % IV BOLUS
1000.0000 mL | Freq: Once | INTRAVENOUS | Status: AC
  Administered 2023-10-12: 1000 mL via INTRAVENOUS

## 2023-10-12 MED ORDER — PROPOFOL 10 MG/ML IV BOLUS
0.5000 mg/kg | Freq: Once | INTRAVENOUS | Status: AC
  Administered 2023-10-12 (×2): 50 mg via INTRAVENOUS
  Administered 2023-10-12 (×2): 25 mg via INTRAVENOUS
  Administered 2023-10-12: 50 mg via INTRAVENOUS
  Filled 2023-10-12: qty 20

## 2023-10-12 MED ORDER — ONDANSETRON HCL 4 MG/2ML IJ SOLN
4.0000 mg | Freq: Once | INTRAMUSCULAR | Status: AC
  Administered 2023-10-12: 4 mg via INTRAVENOUS
  Filled 2023-10-12: qty 2

## 2023-10-12 NOTE — Discharge Instructions (Addendum)
 Overall recommend Tylenol 1000 mg every 6 hours as needed for pain.  Recommend Roxicodone , narcotic pain medicine every 6 hours as needed for pain.  This medication is sedating so please be careful with its use.  Please keep your splint clean and dry.  Do not let it get wet.  If it does get wet call orthopedic office for new splint.  Use crutches do not bear any weight to your right lower extremity.  Follow-up with orthopedics, call their office to get a follow-up appointment.  You will need surgery for this.

## 2023-10-12 NOTE — ED Notes (Signed)
 RT set up for conscious sedation per protocol. Waiting to begin procedure.

## 2023-10-12 NOTE — ED Notes (Addendum)
 Respiratory Therapist at [facility]  in room number 1 during procedure.  Suction with [place] at [facility] set up and ready to use. Ambu bag at [facility] and ready to use.  Patient placed on ETCO2 Nasal Cannula at 2 LPM.  Vitals at conclusion of procedure:  ETCO2 40 mmHg HR 78 RR 16  SPO2 100%  Patient awake and able to verbalize name.

## 2023-10-12 NOTE — ED Provider Notes (Signed)
 [place] [facility] [place] Provider Note   CSN: [id number] Arrival date & time: 10/12/23  1340     Patient presents with: Ankle Pain   Jason Mullins is a 73 y.o. male.   Patient here with right ankle pain after he stepped in a hole while walking.  Twisted the ankle pain and swelling.  Has been able to put weight on it since.  Denies any weakness numbness tingling.  History of hypertension and asthma.  Denies being on any blood thinners.  No pain elsewhere.  Did not hit his head or lose consciousness.  The history is provided by the patient.       Prior to Admission medications   Medication Sig Start Date End Date Taking? Authorizing Provider  oxyCODONE  (ROXICODONE ) 5 MG immediate release tablet Take 1 tablet (5 mg total) by mouth every 6 (six) hours as needed for up to 10 doses. 10/12/23  Yes Sharni Negron, DO  amLODipine  (NORVASC ) 5 MG tablet TAKE 1 TABLET (5 MG TOTAL) BY MOUTH DAILY. Patient not taking: Reported on 05/18/2023 12/06/22   Copland, Harlene BROCKS, MD  Blood Pressure Monitoring (B-D ASSURE BPM/AUTO ARM CUFF) MISC 1 Device by Does not apply route daily as needed. Use to check BP as needed 03/22/21   Copland, Jessica C, MD  hydrochlorothiazide  (MICROZIDE ) 12.5 MG capsule Take 1 capsule (12.5 mg total) by mouth daily. Patient not taking: Reported on 05/18/2023 10/31/22   Copland, Harlene BROCKS, MD  losartan  (COZAAR ) 100 MG tablet Take 1 tablet (100 mg total) by mouth daily. Needs appt 08/22/23   Copland, Harlene BROCKS, MD  triamcinolone  cream (KENALOG ) 0.5 % Apply 1 Application topically 2 (two) times daily. Use as needed for eczema 11/17/21   Copland, Jessica C, MD    Allergies: Patient has no known allergies.    Review of Systems  Updated Vital Signs BP (!) 156/77   Pulse (!) 53   Temp 98.3 F (36.8 C) (Oral)   Resp 15   Wt 88.9 kg   SpO2 100%   BMI 23.86 kg/m   Physical Exam Vitals and nursing note reviewed.  Constitutional:      General:  He is not in acute distress.    Appearance: He is well-developed. He is not ill-appearing.  HENT:     Head: Normocephalic and atraumatic.     Mouth/Throat:     Mouth: Mucous membranes are moist.  Eyes:     Extraocular Movements: Extraocular movements intact.     Conjunctiva/sclera: Conjunctivae normal.     Pupils: Pupils are equal, round, and reactive to light.  Cardiovascular:     Rate and Rhythm: Normal rate and regular rhythm.     Pulses: Normal pulses.     Heart sounds: No murmur heard. Pulmonary:     Effort: Pulmonary effort is normal. No respiratory distress.     Breath sounds: Normal breath sounds.  Abdominal:     Palpations: Abdomen is soft.     Tenderness: There is no abdominal tenderness.  Musculoskeletal:        General: Swelling, tenderness and deformity present.     Cervical back: Normal range of motion and neck supple.     Comments: Tenderness and deformity to the right ankle with swelling  Skin:    General: Skin is warm and dry.     Capillary Refill: Capillary refill takes less than 2 seconds.  Neurological:     General: No focal deficit present.  Mental Status: He is alert.     Sensory: No sensory deficit.     Motor: No weakness.  Psychiatric:        Mood and Affect: Mood normal.     (all labs ordered are listed, but only abnormal results are displayed) Labs Reviewed - No data to display  EKG: None  Radiology: DG Ankle Right Port Result Date: 10/12/2023 CLINICAL DATA:  [id number] Pain [id number] EXAM: PORTABLE RIGHT ANKLE - 2 VIEW COMPARISON:  October 12, 2023 FINDINGS: Overlying cast material limits evaluation of fine bony detail.Significantly improved alignment of the trimalleolar fracture-dislocation of the right ankle. While there is minimal persistent lateral displacement of the medial malleolus, the remainder of the ankle appears in near anatomic alignment. Soft tissues are unremarkable. IMPRESSION: Significantly improved alignment of the trimalleolar  fracture-dislocation status post reduction. Electronically Signed   By: Rogelia Myers M.D.   On: 10/12/2023 16:01   DG Ankle Complete Right Result Date: 10/12/2023 CLINICAL DATA:  ankle pain EXAM: RIGHT ANKLE - COMPLETE 3+ VIEW COMPARISON:  None Available. FINDINGS: Transverse fracture of the distal fibula at the level of the tibial plafond with 4 mm of lateral displacement of the distal fibular fragment. There is also approximately 40 degrees of lateral angulation. Transverse fracture through the medial malleolus with approximately 1.3 cm of lateral displacement. Posterolateral dislocation of the talus with respect to the tibiotalar joint. Longitudinally oriented fracture of the posterior malleolus with approximately 1 cm of posterior displacement. The lateral and medial malleolar fragments remain closely approximated to the talus. Severe soft tissue swelling about the ankle. IMPRESSION: Trimalleolar fracture/dislocation of the right ankle, as delineated above. Electronically Signed   By: Rogelia Myers M.D.   On: 10/12/2023 15:03     .Sedation  Date/Time: 10/12/2023 3:18 PM  Performed by: Ruthe Cornet, DO Authorized by: Ruthe Cornet, DO   Consent:    Consent obtained:  Written   Consent given by:  Patient   Risks discussed:  Allergic reaction, dysrhythmia, inadequate sedation, nausea, vomiting, respiratory compromise necessitating ventilatory assistance and intubation, prolonged sedation necessitating reversal and prolonged hypoxia resulting in organ damage   Alternatives discussed:  Analgesia without sedation Universal protocol:    Procedure explained and questions answered to patient or proxy's satisfaction: yes     Relevant documents present and verified: yes     Test results available: yes     Imaging studies available: yes     Immediately prior to procedure, a time out was called: yes     Patient identity confirmed:  Arm band Indications:    Procedure performed:  Fracture  reduction   Procedure necessitating sedation performed by:  Physician performing sedation Pre-sedation assessment:    Time since last food or drink:  Hours   ASA classification: class 1 - normal, healthy patient     Mouth opening:  3 or more finger widths   Thyromental distance:  4 finger widths   Mallampati score:  I - soft palate, uvula, fauces, pillars visible   Neck mobility: normal     Pre-sedation assessments completed and reviewed: airway patency, cardiovascular function, hydration status, mental status, nausea/vomiting, pain level, respiratory function and temperature   A pre-sedation assessment was completed prior to the start of the procedure Immediate pre-procedure details:    Reassessment: Patient reassessed immediately prior to procedure     Reviewed: vital signs, relevant labs/tests and NPO status     Verified: bag valve mask available, emergency equipment available, intubation equipment available, IV  patency confirmed, oxygen available and reversal medications available   Procedure details (see MAR for exact dosages):    Preoxygenation:  Nasal cannula   Sedation:  Propofol    Intended level of sedation: deep and moderate (conscious sedation)   Intra-procedure monitoring:  Blood pressure monitoring, cardiac monitor, continuous pulse oximetry, continuous capnometry, frequent LOC assessments and frequent vital sign checks   Total Provider sedation time (minutes):  25 Post-procedure details:   A post-sedation assessment was completed following the completion of the procedure.   Attendance: Constant attendance by certified staff until patient recovered     Recovery: Patient returned to pre-procedure baseline     Post-sedation assessments completed and reviewed: airway patency, cardiovascular function, hydration status, mental status, nausea/vomiting, pain level, respiratory function and temperature     Patient is stable for discharge or admission: yes     Procedure completion:   Tolerated well, no immediate complications .Reduction of fracture  Date/Time: 10/12/2023 4:12 PM  Performed by: Ruthe Cornet, DO Authorized by: Ruthe Cornet, DO  Consent: Written consent obtained Risks and benefits: risks, benefits and alternatives were discussed Consent given by: patient Patient understanding: patient states understanding of the procedure being performed Patient consent: the patient's understanding of the procedure matches consent given Procedure consent: procedure consent matches procedure scheduled Relevant documents: relevant documents present and verified Required items: required blood products, implants, devices, and special equipment available Patient identity confirmed: verbally with patient, arm band and provided demographic data Time out: Immediately prior to procedure a time out was called to verify the correct patient, procedure, equipment, support staff and site/side marked as required. Preparation: Patient was prepped and draped in the usual sterile fashion. Local anesthesia used: no  Anesthesia: Local anesthesia used: no  Sedation: Patient sedated: yes Sedation type: moderate (conscious) sedation Sedatives: propofol  Vitals: Vital signs were monitored during sedation.  Patient tolerance: patient tolerated the procedure well with no immediate complications Comments: See sedation note for further details      Medications Ordered in the ED  sodium chloride  0.9 % bolus 1,000 mL (has no administration in time range)  propofol  (DIPRIVAN ) 10 mg/mL bolus/IV push 44.5 mg (25 mg Intravenous Given 10/12/23 1544)  ondansetron  (ZOFRAN ) injection 4 mg (4 mg Intravenous Given 10/12/23 1508)  fentaNYL  (SUBLIMAZE ) injection 50 mcg (50 mcg Intravenous Given 10/12/23 1545)                                    Medical Decision Making Amount and/or Complexity of Data Reviewed Radiology: ordered.  Risk Prescription drug management.   Jason Mullins is here  with right ankle injury.  No pain elsewhere.  Not on blood thinners.  Did not hit his head or lose consciousness.  Discussed deformity and swelling to the right ankle.  X-ray was performed per my review and interpretation shows trimalleolar fracture.  He is neurovascular neuromuscular intact on exam.  Have consented him for propofol  sedation and reduction we will likely pursue CT scan afterwards.  He is not in any discomfort at this time.  Overall patient tolerated sedation and reduction well.  Postreduction x-ray shows good alignment and improved alignment.  I talked with Dr. Fidel with orthopedics who recommends a CT scan prior to discharge to help with operative planning.  Patient will follow-up with Dr. Barton.  Will be nonweightbearing with crutches.  Will write him for oxycodone  for breakthrough pain.  He is neurovascularly intact before  and after exam.  No concern for open fracture.  Patient tolerated sedation and reduction well.  Discharged in good condition.  Understands return precautions.  This chart was dictated using voice recognition software.  Despite best efforts to proofread,  errors can occur which can change the documentation meaning.      Final diagnoses:  Closed trimalleolar fracture of right ankle, initial encounter    ED Discharge Orders          Ordered    oxyCODONE  (ROXICODONE ) 5 MG immediate release tablet  Every 6 hours PRN        10/12/23 1615               Ruthe Cornet, DO 10/12/23 1710

## 2023-10-12 NOTE — ED Triage Notes (Signed)
 States stepped in hole while walking. Twisted R ankle. Swelling noted. Denies loss of sensation

## 2023-10-17 DIAGNOSIS — S82851A Displaced trimalleolar fracture of right lower leg, initial encounter for closed fracture: Secondary | ICD-10-CM | POA: Diagnosis not present

## 2023-10-18 ENCOUNTER — Other Ambulatory Visit: Payer: Self-pay

## 2023-10-18 ENCOUNTER — Encounter (HOSPITAL_BASED_OUTPATIENT_CLINIC_OR_DEPARTMENT_OTHER): Payer: Self-pay | Admitting: Orthopaedic Surgery

## 2023-10-18 NOTE — Discharge Instructions (Addendum)
 Lillia Mountain, MD [facility]  Please read the following information regarding your care after surgery.  Medications  You only need a prescription for the narcotic pain medicine (ex. oxycodone , Percocet, Norco).  All of the other medicines listed below are available over the counter. ? acetaminophen (Tylenol) 500 mg every 4-6 hours as you need for minor to moderate pain  ? To help prevent blood clots, take aspirin (81 mg) twice daily for at least 42 days after surgery.  You should also get up every hour while you are awake to move around.  Weight Bearing ? Do NOT bear any weight on the operated leg or foot. This means do NOT touch your surgical leg to the ground!  Cast / Splint / Dressing ? If you have a splint, do NOT remove this. Keep your splint, cast or dressing clean and dry.  Don't put anything (coat hanger, pencil, etc) down inside of it.  If it gets wet, call the office immediately to schedule an appointment for a cast change.  Swelling IMPORTANT: It is normal for you to have swelling where you had surgery. To reduce swelling and pain, keep at least 3 pillows under your leg so that your toes are above your nose and your heel is above the level of your hip.  It may be necessary to keep your foot or leg elevated for several weeks.  This is critical to helping your incisions heal and your pain to feel better.  Follow Up Call my office at [phone] when you are discharged from the hospital or surgery center to schedule an appointment to be seen 7-10 days after surgery.  Call my office at [phone] if you develop a fever >101.5 F, nausea, vomiting, bleeding from the surgical site or severe pain.     Post Anesthesia Home Care Instructions  Activity: Get plenty of rest for the remainder of the day. A responsible individual must stay with you for 24 hours following the procedure.  For the next 24 hours, DO NOT: -Drive a car -Advertising copywriter -Drink alcoholic  beverages -Take any medication unless instructed by your physician -Make any legal decisions or sign important papers.  Meals: Start with liquid foods such as gelatin or soup. Progress to regular foods as tolerated. Avoid greasy, spicy, heavy foods. If nausea and/or vomiting occur, drink only clear liquids until the nausea and/or vomiting subsides. Call your physician if vomiting continues.  Special Instructions/Symptoms: Your throat may feel dry or sore from the anesthesia or the breathing tube placed in your throat during surgery. If this causes discomfort, gargle with warm salt water. The discomfort should disappear within 24 hours.  Regional Anesthesia Blocks  1. You may not be able to move or feel the blocked extremity after a regional anesthetic block. This may last may last from 3-48 hours after placement, but it will go away. The length of time depends on the medication injected and your individual response to the medication. As the nerves start to wake up, you may experience tingling as the movement and feeling returns to your extremity. If the numbness and inability to move your extremity has not gone away after 48 hours, please call your surgeon.   2. The extremity that is blocked will need to be protected until the numbness is gone and the strength has returned. Because you cannot feel it, you will need to take extra care to avoid injury. Because it may be weak, you may have difficulty moving it or using it. You  may not know what position it is in without looking at it while the block is in effect.  3. For blocks in the legs and feet, returning to weight bearing and walking needs to be done carefully. You will need to wait until the numbness is entirely gone and the strength has returned. You should be able to move your leg and foot normally before you try and bear weight or walk. You will need someone to be with you when you first try to ensure you do not fall and possibly risk  injury.  4. Bruising and tenderness at the needle site are common side effects and will resolve in a few days.  5. Persistent numbness or new problems with movement should be communicated to the surgeon or the [facility] [phone] [facility] [phone]).  Next dose of tylenol will be at 8:00pm

## 2023-10-18 NOTE — H&P (Signed)
 ORTHOPAEDIC SURGERY H&P  Subjective:  The patient presents for right ankle fx.   Past Medical History:  Diagnosis Date   Asthma    History of hepatitis C    Cured   Hypertension     Past Surgical History:  Procedure Laterality Date   CORRECTION HAMMER TOE       (Not in an outpatient encounter)    No Known Allergies  Social History   Socioeconomic History   Marital status: Married    Spouse name: Neville   Number of children: 2   Years of education: Not on file   Highest education level: Not on file  Occupational History   Not on file  Tobacco Use   Smoking status: Former    Types: Cigars   Smokeless tobacco: Never   Tobacco comments:    1 cigar a month  Vaping Use   Vaping status: Never Used  Substance and Sexual Activity   Alcohol use: Yes    Alcohol/week: 1.0 standard drink of alcohol    Types: 1 Cans of beer per week   Drug use: No   Sexual activity: Yes  Other Topics Concern   Not on file  Social History Narrative   Married in 1974.   2 sons.   4 grandsons.   Active in church. Goes to Gym 3x per week.    Social Drivers of Corporate investment banker Strain: Low Risk  (05/18/2023)   Overall Financial Resource Strain (CARDIA)    Difficulty of Paying Living Expenses: Not hard at all  Food Insecurity: No Food Insecurity (05/18/2023)   Hunger Vital Sign    Worried About Running Out of Food in the Last Year: Never true    Ran Out of Food in the Last Year: Never true  Transportation Needs: No Transportation Needs (05/18/2023)   PRAPARE - Administrator, Civil Service (Medical): No    Lack of Transportation (Non-Medical): No  Physical Activity: Insufficiently Active (05/18/2023)   Exercise Vital Sign    Days of Exercise per Week: 3 days    Minutes of Exercise per Session: 40 min  Stress: No Stress Concern Present (05/18/2023)   [employer] of Occupational Health - Occupational Stress Questionnaire    Feeling of Stress : Not at all   Social Connections: Socially Integrated (05/18/2023)   Social Connection and Isolation Panel    Frequency of Communication with Friends and Family: More than three times a week    Frequency of Social Gatherings with Friends and Family: More than three times a week    Attends Religious Services: More than 4 times per year    Active Member of [employer] or Organizations: Yes    Attends Banker Meetings: More than 4 times per year    Marital Status: Married  Catering manager Violence: Not At Risk (05/18/2023)   Humiliation, Afraid, Rape, and Kick questionnaire    Fear of Current or Ex-Partner: No    Emotionally Abused: No    Physically Abused: No    Sexually Abused: No     History reviewed. No pertinent family history.   Review of Systems Pertinent items are noted in HPI.  Objective: Vital signs in last 24 hours:    10/12/2023    5:25 PM 10/12/2023    4:45 PM 10/12/2023    4:30 PM  Vitals with BMI  Systolic 166 166 841  Diastolic 80 82 86  Pulse 61 55 54  EXAM: General: Well nourished, well developed. Awake, alert and oriented to time, place, person. Normal mood and affect. No apparent distress. Breathing room air.  Operative Lower Extremity: Alignment - Neutral Deformity - None Skin intact Tenderness to palpation - right ankle 5/5 TA, PT, GS, Per, EHL, FHL Sensation intact to light touch throughout Palpable DP and PT pulses Special testing: None  The contralateral foot/ankle was examined for comparison and noted to be neurovascularly intact with no localized deformity, swelling, or tenderness.  Imaging Review All images taken were independently reviewed by me.  Assessment/Plan: The clinical and radiographic findings were reviewed and discussed at length with the patient.  The patient presents for right ankle fx.  We spoke at length about the natural course of these findings. We discussed nonoperative and operative treatment options in detail.  The  risks and benefits were presented and reviewed. The risks due to suture/hardware failure/irritation (or if removing hardware inability to remove part/all of hardware, recurrent instability), new/persistent/recurrent infection, stiffness, nerve/vessel/tendon injury, nonunion/malunion of any fracture, wound healing issues, allograft usage, development of arthritis, failure of this surgery, possibility of external fixation in certain situations, possibility of delayed definitive surgery, need for further surgery, prolonged wound care including further soft tissue coverage procedures, thromboembolic events, anesthesia/medical complications/events perioperatively and beyond, amputation, death among others were discussed. The patient acknowledged the explanation and agreed to proceed with the plan.  Jason Mullins  Orthopaedic Surgery [facility]

## 2023-10-21 ENCOUNTER — Other Ambulatory Visit: Payer: Self-pay

## 2023-10-21 ENCOUNTER — Emergency Department (HOSPITAL_BASED_OUTPATIENT_CLINIC_OR_DEPARTMENT_OTHER)

## 2023-10-21 ENCOUNTER — Encounter (HOSPITAL_BASED_OUTPATIENT_CLINIC_OR_DEPARTMENT_OTHER): Payer: Self-pay | Admitting: Emergency Medicine

## 2023-10-21 ENCOUNTER — Emergency Department (HOSPITAL_BASED_OUTPATIENT_CLINIC_OR_DEPARTMENT_OTHER)
Admission: EM | Admit: 2023-10-21 | Discharge: 2023-10-21 | Disposition: A | Discharge status: Home / Self Care | Attending: Emergency Medicine | Admitting: Emergency Medicine

## 2023-10-21 DIAGNOSIS — Z5189 Encounter for other specified aftercare: Secondary | ICD-10-CM

## 2023-10-21 DIAGNOSIS — Z4800 Encounter for change or removal of nonsurgical wound dressing: Secondary | ICD-10-CM | POA: Insufficient documentation

## 2023-10-21 DIAGNOSIS — S82851A Displaced trimalleolar fracture of right lower leg, initial encounter for closed fracture: Secondary | ICD-10-CM | POA: Diagnosis not present

## 2023-10-21 DIAGNOSIS — S8251XA Displaced fracture of medial malleolus of right tibia, initial encounter for closed fracture: Secondary | ICD-10-CM | POA: Diagnosis not present

## 2023-10-21 DIAGNOSIS — S8261XA Displaced fracture of lateral malleolus of right fibula, initial encounter for closed fracture: Secondary | ICD-10-CM | POA: Diagnosis not present

## 2023-10-21 DIAGNOSIS — S82831A Other fracture of upper and lower end of right fibula, initial encounter for closed fracture: Secondary | ICD-10-CM | POA: Diagnosis not present

## 2023-10-21 DIAGNOSIS — X501XXA Overexertion from prolonged static or awkward postures, initial encounter: Secondary | ICD-10-CM | POA: Insufficient documentation

## 2023-10-21 DIAGNOSIS — S91001A Unspecified open wound, right ankle, initial encounter: Secondary | ICD-10-CM | POA: Diagnosis not present

## 2023-10-21 DIAGNOSIS — M7989 Other specified soft tissue disorders: Secondary | ICD-10-CM | POA: Diagnosis not present

## 2023-10-21 DIAGNOSIS — S99911A Unspecified injury of right ankle, initial encounter: Secondary | ICD-10-CM | POA: Diagnosis present

## 2023-10-21 MED ORDER — PROPOFOL 10 MG/ML IV BOLUS
INTRAVENOUS | Status: AC | PRN
  Administered 2023-10-21 (×2): 25 mg via INTRAVENOUS
  Administered 2023-10-21: 50 mg via INTRAVENOUS
  Administered 2023-10-21 (×2): 25 mg via INTRAVENOUS

## 2023-10-21 MED ORDER — FENTANYL CITRATE PF 50 MCG/ML IJ SOSY
PREFILLED_SYRINGE | INTRAMUSCULAR | Status: AC
  Filled 2023-10-21: qty 1

## 2023-10-21 MED ORDER — FENTANYL CITRATE PF 50 MCG/ML IJ SOSY
50.0000 ug | PREFILLED_SYRINGE | Freq: Once | INTRAMUSCULAR | Status: DC
  Filled 2023-10-21: qty 1

## 2023-10-21 MED ORDER — PROPOFOL 10 MG/ML IV BOLUS
0.5000 mg/kg | Freq: Once | INTRAVENOUS | Status: AC
  Administered 2023-10-21: 45.4 mg via INTRAVENOUS
  Filled 2023-10-21: qty 20

## 2023-10-21 MED ORDER — SODIUM CHLORIDE 0.9 % IV BOLUS
1000.0000 mL | Freq: Once | INTRAVENOUS | Status: AC
  Administered 2023-10-21: 1000 mL via INTRAVENOUS

## 2023-10-21 MED ORDER — ONDANSETRON HCL 4 MG/2ML IJ SOLN
4.0000 mg | Freq: Once | INTRAMUSCULAR | Status: AC
  Administered 2023-10-21: 4 mg via INTRAVENOUS
  Filled 2023-10-21: qty 2

## 2023-10-21 MED ORDER — FENTANYL CITRATE PF 50 MCG/ML IJ SOSY
PREFILLED_SYRINGE | INTRAMUSCULAR | Status: AC | PRN
  Administered 2023-10-21 (×2): 50 ug via INTRAVENOUS

## 2023-10-21 NOTE — ED Provider Notes (Signed)
 [place] [facility] Provider Note   CSN: [id number] Arrival date & time: 10/21/23  2045     Patient presents with: Wound Check   Jason Mullins is a 73 y.o. male.   Patient here for ongoing right ankle pain, felt like there was something wrong with the ankle splint that he had placed here a few days ago by myself.  He fractured his right ankle.  He followed up with orthopedic and he was scheduled for repair in a couple days.  Started to have increased irritation from his splints that increased over the last few days.  Started to notice there was some blood coming from the splint.  When he took the splint off he noticed some blisters then breakdown of the skin.  Not having any major pain.  Denies any fever.  Followed up with Dr. Barton for this originally.  The history is provided by the patient.       Prior to Admission medications   Medication Sig Start Date End Date Taking? Authorizing Provider  acetaminophen (TYLENOL) 500 MG tablet Take 1,000 mg by mouth every 6 (six) hours as needed.    [provider]  amLODipine  (NORVASC ) 5 MG tablet TAKE 1 TABLET (5 MG TOTAL) BY MOUTH DAILY. 12/06/22   Copland, Harlene BROCKS, MD  Blood Pressure Monitoring (B-D ASSURE BPM/AUTO ARM CUFF) MISC 1 Device by Does not apply route daily as needed. Use to check BP as needed 03/22/21   Copland, Jessica C, MD  losartan  (COZAAR ) 100 MG tablet Take 1 tablet (100 mg total) by mouth daily. Needs appt 08/22/23   Copland, Harlene BROCKS, MD  Multiple Vitamin (MULTIVITAMIN WITH MINERALS) TABS tablet Take 1 tablet by mouth daily.    [provider]  oxyCODONE  (ROXICODONE ) 5 MG immediate release tablet Take 1 tablet (5 mg total) by mouth every 6 (six) hours as needed for up to 10 doses. 10/12/23   Fremon Zacharia, DO  triamcinolone  cream (KENALOG ) 0.5 % Apply 1 Application topically 2 (two) times daily. Use as needed for eczema 11/17/21   Copland, Jessica C, MD     Allergies: Patient has no known allergies.    Review of Systems  Updated Vital Signs BP (!) 154/92 (BP Location: Right Arm)   Pulse 71   Temp 99.3 F (37.4 C) (Oral)   Resp 16   Ht 6' (1.829 m)   Wt 90.7 kg   SpO2 100%   BMI 27.12 kg/m   Physical Exam Vitals and nursing note reviewed.  Constitutional:      General: He is not in acute distress.    Appearance: He is well-developed. He is not ill-appearing.  HENT:     Head: Normocephalic and atraumatic.  Eyes:     Conjunctiva/sclera: Conjunctivae normal.  Cardiovascular:     Rate and Rhythm: Normal rate and regular rhythm.     Pulses: Normal pulses.     Heart sounds: No murmur heard. Pulmonary:     Effort: Pulmonary effort is normal. No respiratory distress.     Breath sounds: Normal breath sounds.  Abdominal:     Palpations: Abdomen is soft.     Tenderness: There is no abdominal tenderness.  Musculoskeletal:        General: No swelling.     Cervical back: Normal range of motion and neck supple.  Skin:    General: Skin is warm and dry.     Capillary Refill: Capillary refill takes less than 2  seconds.     Comments: Swelling to the right ankle/top of the right foot, blisters of the anterior shin and exposed superficial skin to the lateral portion  Neurological:     Mental Status: He is alert.  Psychiatric:        Mood and Affect: Mood normal.     (all labs ordered are listed, but only abnormal results are displayed) Labs Reviewed - No data to display  EKG: None  Radiology: DG Ankle Complete Right Result Date: 10/21/2023 EXAM: 3 OR MORE VIEW(S) XRAY OF THE RIGHT ANKLE 10/21/2023 09:22:00 PM CLINICAL HISTORY: pain. Pt had sedation/reduction here on 9/18 (RT ankle); took splint off d/t it was bothering me. Schedule for ORIF on 10/25/2023.; Patient has an open wound to posteromedial aspect of right ankle, mult blisters. Denies pain or fever. COMPARISON: 10/12/2023 FINDINGS: BONES AND JOINTS: Increased  displacement of medial malleolus fracture fragment. Increased lateral displacement of lateral malleolus fracture fragment. Redemonstrated posterior malleolar fracture. Increased lateral subluxation of talus with respect to tibia, now displaced laterally 1/2 width of the tibia . SOFT TISSUES: Soft tissue swelling. IMPRESSION: 1. Worsened alignment of the trimalleolar fracture dislocation compared to Oct 12 2023. Electronically signed by: Norman Gatlin MD 10/21/2023 09:36 PM EDT RP Workstation: [id number]     .Sedation  Date/Time: 10/21/2023 9:54 PM  Performed by: Ruthe Cornet, DO Authorized by: Ruthe Cornet, DO   Consent:    Consent obtained:  Verbal   Consent given by:  Patient   Risks discussed:  Allergic reaction, dysrhythmia, inadequate sedation, nausea, prolonged hypoxia resulting in organ damage, prolonged sedation necessitating reversal, respiratory compromise necessitating ventilatory assistance and intubation and vomiting   Alternatives discussed:  Analgesia without sedation, anxiolysis and regional anesthesia Universal protocol:    Procedure explained and questions answered to patient or proxy's satisfaction: yes     Relevant documents present and verified: yes     Test results available: yes     Imaging studies available: yes     Required blood products, implants, devices, and special equipment available: yes     Site/side marked: yes     Immediately prior to procedure, a time out was called: yes     Patient identity confirmed:  Verbally with patient and arm band Indications:    Procedure performed:  Fracture reduction   Procedure necessitating sedation performed by:  Physician performing sedation Pre-sedation assessment:    Time since last food or drink:  8 hours   ASA classification: class 2 - patient with mild systemic disease     Mouth opening:  3 or more finger widths   Thyromental distance:  4 finger widths   Mallampati score:  I - soft palate, uvula, fauces,  pillars visible   Neck mobility: normal     Pre-sedation assessments completed and reviewed: airway patency, cardiovascular function, hydration status, mental status, nausea/vomiting, pain level, respiratory function and temperature   A pre-sedation assessment was completed prior to the start of the procedure Immediate pre-procedure details:    Reassessment: Patient reassessed immediately prior to procedure     Reviewed: vital signs, relevant labs/tests and NPO status     Verified: bag valve mask available, emergency equipment available, intubation equipment available, IV patency confirmed, oxygen available and suction available   Procedure details (see MAR for exact dosages):    Preoxygenation:  Nasal cannula   Sedation:  Propofol    Intended level of sedation: deep   Intra-procedure monitoring:  Blood pressure monitoring, cardiac monitor, continuous pulse oximetry,  frequent LOC assessments, frequent vital sign checks and continuous capnometry   Intra-procedure events: none     Total Provider sedation time (minutes):  25 Post-procedure details:   A post-sedation assessment was completed following the completion of the procedure.   Attendance: Constant attendance by certified staff until patient recovered     Recovery: Patient returned to pre-procedure baseline     Post-sedation assessments completed and reviewed: airway patency, cardiovascular function, hydration status, mental status, nausea/vomiting, pain level, respiratory function and temperature     Patient is stable for discharge or admission: yes     Procedure completion:  Tolerated well, no immediate complications .Ortho Injury Treatment  Date/Time: 10/21/2023 10:45 PM  Performed by: Ruthe Cornet, DO Authorized by: Ruthe Cornet, DO   Consent:    Consent obtained:  Verbal and written   Consent given by:  Patient   Risks discussed:  Fracture, irreducible dislocation, nerve damage, recurrent dislocation and restricted joint  movement   Alternatives discussed:  No treatmentInjury location: ankle Location details: right ankle Injury type: fracture-dislocation Fracture type: trimalleolar Pre-procedure neurovascular assessment: neurovascularly intact Pre-procedure distal perfusion: normal Pre-procedure neurological function: normal Pre-procedure range of motion: reduced  Patient sedated: Yes. Refer to sedation procedure documentation for details of sedation. Manipulation performed: yes Reduction successful: yes X-ray confirmed reduction: yes Immobilization: splint Splint type: ankle stirrup Splint Applied by: ED Provider and ED Tech Post-procedure neurovascular assessment: post-procedure neurovascularly intact Post-procedure distal perfusion: normal Post-procedure neurological function: normal      Medications Ordered in the ED  propofol  (DIPRIVAN ) 10 mg/mL bolus/IV push (25 mg Intravenous Given 10/21/23 2225)  fentaNYL  (SUBLIMAZE ) injection (50 mcg Intravenous Given 10/21/23 2234)  fentaNYL  (SUBLIMAZE ) 50 MCG/ML injection (has no administration in time range)  fentaNYL  (SUBLIMAZE ) injection 50 mcg (has no administration in time range)  propofol  (DIPRIVAN ) 10 mg/mL bolus/IV push 45.4 mg (45.4 mg Intravenous Given 10/21/23 2217)  ondansetron  (ZOFRAN ) injection 4 mg (4 mg Intravenous Given 10/21/23 2211)  sodium chloride  0.9 % bolus 1,000 mL (1,000 mLs Intravenous New Bag/Given 10/21/23 2216)                                    Medical Decision Making Amount and/or Complexity of Data Reviewed Radiology: ordered.  Risk Prescription drug management.   Lynwood Bough is here with splint issue/ongoing right ankle pain.  I actually saw this patient about 9 days ago for trimalleolar right ankle fracture that I reduced.  He is followed up with orthopedics and uses to have surgery this week to repair.  But he started having irritation with the splint.  The last several days and noticed that there was some blood on  the splint then he decided to take it off tonight prior to coming here.  He noticed some large blisters and some broken down skin.  He is not having any major pain.  Denies any fever.  Overall I took pictures of the skin and put in the media file.  Looks like he has significant swelling to the ankle and top of his foot with some pressure blisters and some broken down blisters on the medial portion.  He is got Doppler pulses.  X-ray shows worsening alignment then postreduction films which was suspected after patient removed splint tonight.  He does not have fever.  There is no major purulent drainage or major infectious process.  I will talk with Dr. Barton orthopedic team on-call  for recommendations.  Overall I talked with Dr. Fidel orthopedic.  Overall he reviewed photos and overall these are fracture blisters from the splint and from the fracture and swelling.  Ultimately x-ray showed worsening alignment of the trimalleolar fracture dislocation compared to my postreduction film from last week.  At this time Dr. Fidel recommended that we sedate and reduce again.  Patient was consented for this.  We were able to sedate with propofol  and got improved alignment of fracture dislocation on repeat films that I reviewed and interpreted.  I talked with Dr. Fidel again who is happy with alignment as well.  Patient will call his orthopedic, Dr. Netty Monday morning for further care.  Patient discharged in good condition.  Handle sedation and reduction well.  Educated about splint care.  This chart was dictated using voice recognition software.  Despite best efforts to proofread,  errors can occur which can change the documentation meaning.      Final diagnoses:  Visit for wound check  Closed trimalleolar fracture of right ankle, initial encounter    ED Discharge Orders     None          Ruthe Cornet, DO 10/21/23 2248

## 2023-10-21 NOTE — Discharge Instructions (Signed)
 Please keep your splint in place.  The blisters that you had are from fracture or swelling in the splint.  Overall please call your orthopedic physician on Monday morning to discuss.  You may need to follow-up with them before your surgery.  Please do not bear any weight to your splint.  Keep your splint clean and dry.

## 2023-10-21 NOTE — Sedation Documentation (Signed)
 Pt still awake, V/O given for 25mg  Propofol  IV.

## 2023-10-21 NOTE — ED Triage Notes (Signed)
 Pt had sedation/reduction here on 9/18 (RT ankle); took splint off d/t it was bothering me- sts there was blood in the splint and he noted some blisters; scheduled for ORIF on 10/1

## 2023-10-21 NOTE — ED Notes (Signed)
 SHelRespiratory Therapist at [facility]  in room number 4 during procedure.  Suction with [facility] at [facility] set up and ready to use. Ambu bag at [facility] and ready to use.  Patient placed on ETCO2 Nasal Cannula at 2 LPM.  Vitals at conclusion of procedure:  ETCO2 44 mmHg HR  61 RR  11 SPO2 100%  Patient awake and able to verbalize name.

## 2023-10-21 NOTE — ED Notes (Signed)
 SHelRespiratory Therapist at [facility]  in room number 4 during procedure.  Suction with Yaunker at [facility] set up and ready to use. Ambu bag at [facility] and ready to use.  Patient placed on ETCO2 Nasal Cannula at 4 LPM.  Vitals prior to procedure:  ETCO2 44 mmHg HR 75 RR 12 SPO2 100%  Patient awake and able to verbalize name.

## 2023-10-21 NOTE — Sedation Documentation (Signed)
 Reduction complete. Splint application underway.

## 2023-10-21 NOTE — Sedation Documentation (Signed)
 Pt still awake. Verbal order given for 25 mg Propofol  IV.

## 2023-10-23 ENCOUNTER — Encounter (HOSPITAL_BASED_OUTPATIENT_CLINIC_OR_DEPARTMENT_OTHER)
Admission: RE | Admit: 2023-10-23 | Discharge: 2023-10-23 | Disposition: A | Discharge status: Home / Self Care | Source: Ambulatory Visit | Attending: Orthopaedic Surgery | Admitting: Orthopaedic Surgery

## 2023-10-23 DIAGNOSIS — Z01810 Encounter for preprocedural cardiovascular examination: Secondary | ICD-10-CM | POA: Insufficient documentation

## 2023-10-23 DIAGNOSIS — I10 Essential (primary) hypertension: Secondary | ICD-10-CM | POA: Insufficient documentation

## 2023-10-25 ENCOUNTER — Other Ambulatory Visit: Payer: Self-pay

## 2023-10-25 ENCOUNTER — Ambulatory Visit (HOSPITAL_COMMUNITY)

## 2023-10-25 ENCOUNTER — Ambulatory Visit (HOSPITAL_BASED_OUTPATIENT_CLINIC_OR_DEPARTMENT_OTHER)
Admission: RE | Admit: 2023-10-25 | Discharge: 2023-10-25 | Disposition: A | Discharge status: Home / Self Care | Attending: Orthopaedic Surgery | Admitting: Orthopaedic Surgery

## 2023-10-25 ENCOUNTER — Encounter (HOSPITAL_BASED_OUTPATIENT_CLINIC_OR_DEPARTMENT_OTHER): Payer: Self-pay | Admitting: Orthopaedic Surgery

## 2023-10-25 ENCOUNTER — Ambulatory Visit (HOSPITAL_BASED_OUTPATIENT_CLINIC_OR_DEPARTMENT_OTHER): Admitting: Anesthesiology

## 2023-10-25 ENCOUNTER — Encounter (HOSPITAL_BASED_OUTPATIENT_CLINIC_OR_DEPARTMENT_OTHER)
Admission: RE | Disposition: A | Discharge status: Home / Self Care | Payer: Self-pay | Source: Home / Self Care | Attending: Orthopaedic Surgery

## 2023-10-25 DIAGNOSIS — S8251XA Displaced fracture of medial malleolus of right tibia, initial encounter for closed fracture: Secondary | ICD-10-CM | POA: Diagnosis not present

## 2023-10-25 DIAGNOSIS — G4730 Sleep apnea, unspecified: Secondary | ICD-10-CM | POA: Diagnosis not present

## 2023-10-25 DIAGNOSIS — S82851A Displaced trimalleolar fracture of right lower leg, initial encounter for closed fracture: Secondary | ICD-10-CM | POA: Diagnosis not present

## 2023-10-25 DIAGNOSIS — X58XXXA Exposure to other specified factors, initial encounter: Secondary | ICD-10-CM | POA: Insufficient documentation

## 2023-10-25 DIAGNOSIS — S82201A Unspecified fracture of shaft of right tibia, initial encounter for closed fracture: Secondary | ICD-10-CM | POA: Diagnosis not present

## 2023-10-25 DIAGNOSIS — I10 Essential (primary) hypertension: Secondary | ICD-10-CM | POA: Diagnosis not present

## 2023-10-25 DIAGNOSIS — Z79899 Other long term (current) drug therapy: Secondary | ICD-10-CM | POA: Insufficient documentation

## 2023-10-25 DIAGNOSIS — Z87891 Personal history of nicotine dependence: Secondary | ICD-10-CM | POA: Diagnosis not present

## 2023-10-25 DIAGNOSIS — G8918 Other acute postprocedural pain: Secondary | ICD-10-CM | POA: Diagnosis not present

## 2023-10-25 DIAGNOSIS — S93431A Sprain of tibiofibular ligament of right ankle, initial encounter: Secondary | ICD-10-CM | POA: Diagnosis not present

## 2023-10-25 HISTORY — PX: EXTERNAL FIXATION, ANKLE: SHX7570

## 2023-10-25 HISTORY — DX: Sleep apnea, unspecified: G47.30

## 2023-10-25 HISTORY — PX: ORIF ANKLE FRACTURE: SHX5408

## 2023-10-25 HISTORY — DX: Unspecified osteoarthritis, unspecified site: M19.90

## 2023-10-25 HISTORY — PX: SYNDESMOSIS REPAIR: SHX5182

## 2023-10-25 SURGERY — OPEN REDUCTION INTERNAL FIXATION (ORIF) ANKLE FRACTURE
Anesthesia: Regional | Site: Ankle | Laterality: Right

## 2023-10-25 MED ORDER — FENTANYL CITRATE (PF) 100 MCG/2ML IJ SOLN
INTRAMUSCULAR | Status: DC | PRN
  Administered 2023-10-25 (×4): 25 ug via INTRAVENOUS

## 2023-10-25 MED ORDER — AMISULPRIDE (ANTIEMETIC) 5 MG/2ML IV SOLN: 10.0000 mg | Freq: Once | INTRAVENOUS | Status: DC | PRN

## 2023-10-25 MED ORDER — DEXAMETHASONE SODIUM PHOSPHATE 10 MG/ML IJ SOLN
INTRAMUSCULAR | Status: DC | PRN
  Administered 2023-10-25: 8 mg via INTRAVENOUS

## 2023-10-25 MED ORDER — SUCCINYLCHOLINE CHLORIDE 200 MG/10ML IV SOSY
PREFILLED_SYRINGE | INTRAVENOUS | Status: AC
  Filled 2023-10-25: qty 10

## 2023-10-25 MED ORDER — DEXAMETHASONE SODIUM PHOSPHATE 10 MG/ML IJ SOLN
INTRAMUSCULAR | Status: DC | PRN
  Administered 2023-10-25 (×2): 5 mg via PERINEURAL

## 2023-10-25 MED ORDER — DEXAMETHASONE SODIUM PHOSPHATE 10 MG/ML IJ SOLN
INTRAMUSCULAR | Status: AC
  Filled 2023-10-25: qty 1

## 2023-10-25 MED ORDER — OXYCODONE HCL 5 MG PO TABS: 5.0000 mg | ORAL_TABLET | Freq: Once | ORAL | Status: DC | PRN

## 2023-10-25 MED ORDER — FENTANYL CITRATE (PF) 100 MCG/2ML IJ SOLN: 25.0000 ug | INTRAMUSCULAR | Status: DC | PRN

## 2023-10-25 MED ORDER — ONDANSETRON HCL 4 MG/2ML IJ SOLN
INTRAMUSCULAR | Status: AC
  Filled 2023-10-25: qty 4

## 2023-10-25 MED ORDER — ONDANSETRON HCL 4 MG/2ML IJ SOLN
INTRAMUSCULAR | Status: DC | PRN
  Administered 2023-10-25: 4 mg via INTRAVENOUS

## 2023-10-25 MED ORDER — MIDAZOLAM HCL 2 MG/2ML IJ SOLN
INTRAMUSCULAR | Status: AC
  Filled 2023-10-25: qty 2

## 2023-10-25 MED ORDER — EPHEDRINE SULFATE (PRESSORS) 50 MG/ML IJ SOLN
INTRAMUSCULAR | Status: DC | PRN
  Administered 2023-10-25: 15 mg via INTRAVENOUS

## 2023-10-25 MED ORDER — ONDANSETRON HCL 4 MG/2ML IJ SOLN
INTRAMUSCULAR | Status: AC
  Filled 2023-10-25: qty 2

## 2023-10-25 MED ORDER — LACTATED RINGERS IV SOLN: INTRAVENOUS | Status: DC | PRN

## 2023-10-25 MED ORDER — VANCOMYCIN HCL 500 MG IV SOLR
INTRAVENOUS | Status: DC | PRN
  Administered 2023-10-25: 500 mg via TOPICAL

## 2023-10-25 MED ORDER — LIDOCAINE 2% (20 MG/ML) 5 ML SYRINGE
INTRAMUSCULAR | Status: AC
  Filled 2023-10-25: qty 5

## 2023-10-25 MED ORDER — ACETAMINOPHEN 500 MG PO TABS
ORAL_TABLET | ORAL | Status: AC
  Filled 2023-10-25: qty 2

## 2023-10-25 MED ORDER — CHLORHEXIDINE GLUCONATE 4 % EX SOLN: 60.0000 mL | Freq: Once | CUTANEOUS | Status: DC

## 2023-10-25 MED ORDER — FENTANYL CITRATE (PF) 100 MCG/2ML IJ SOLN
INTRAMUSCULAR | Status: AC
  Filled 2023-10-25: qty 2

## 2023-10-25 MED ORDER — FENTANYL CITRATE (PF) 100 MCG/2ML IJ SOLN
100.0000 ug | Freq: Once | INTRAMUSCULAR | Status: AC
  Administered 2023-10-25: 100 ug via INTRAVENOUS

## 2023-10-25 MED ORDER — ACETAMINOPHEN 500 MG PO TABS
1000.0000 mg | ORAL_TABLET | Freq: Once | ORAL | Status: AC
  Administered 2023-10-25: 1000 mg via ORAL

## 2023-10-25 MED ORDER — CEFAZOLIN SODIUM-DEXTROSE 2-4 GM/100ML-% IV SOLN
2.0000 g | INTRAVENOUS | Status: AC
  Administered 2023-10-25: 2 g via INTRAVENOUS

## 2023-10-25 MED ORDER — GLYCOPYRROLATE 0.2 MG/ML IJ SOLN
INTRAMUSCULAR | Status: DC | PRN
  Administered 2023-10-25: .2 mg via INTRAVENOUS

## 2023-10-25 MED ORDER — OXYCODONE HCL 5 MG/5ML PO SOLN: 5.0000 mg | Freq: Once | ORAL | Status: DC | PRN

## 2023-10-25 MED ORDER — MIDAZOLAM HCL 2 MG/2ML IJ SOLN
2.0000 mg | Freq: Once | INTRAMUSCULAR | Status: AC
  Administered 2023-10-25: 2 mg via INTRAVENOUS

## 2023-10-25 MED ORDER — PHENYLEPHRINE HCL-NACL 20-0.9 MG/250ML-% IV SOLN
INTRAVENOUS | Status: DC | PRN
  Administered 2023-10-25: 40 ug/min via INTRAVENOUS

## 2023-10-25 MED ORDER — PHENYLEPHRINE 80 MCG/ML (10ML) SYRINGE FOR IV PUSH (FOR BLOOD PRESSURE SUPPORT)
PREFILLED_SYRINGE | INTRAVENOUS | Status: AC
  Filled 2023-10-25: qty 10

## 2023-10-25 MED ORDER — PROPOFOL 10 MG/ML IV BOLUS
INTRAVENOUS | Status: DC | PRN
Start: 2023-10-25 — End: 2023-10-25
  Administered 2023-10-25: 150 ug via INTRAVENOUS

## 2023-10-25 MED ORDER — CEFAZOLIN SODIUM-DEXTROSE 2-4 GM/100ML-% IV SOLN
INTRAVENOUS | Status: AC
  Filled 2023-10-25: qty 100

## 2023-10-25 MED ORDER — ROPIVACAINE HCL 5 MG/ML IJ SOLN
INTRAMUSCULAR | Status: DC | PRN
  Administered 2023-10-25: 20 mL via PERINEURAL
  Administered 2023-10-25: 30 mL via PERINEURAL

## 2023-10-25 MED ORDER — PHENYLEPHRINE HCL (PRESSORS) 10 MG/ML IV SOLN
INTRAVENOUS | Status: DC | PRN
  Administered 2023-10-25 (×4): 160 ug via INTRAVENOUS

## 2023-10-25 MED ORDER — LACTATED RINGERS IV SOLN
INTRAVENOUS | Status: DC
Start: 2023-10-25 — End: 2023-10-25

## 2023-10-25 MED ORDER — POVIDONE-IODINE 10 % EX SOLN
CUTANEOUS | Status: DC | PRN
  Administered 2023-10-25: 1 via TOPICAL

## 2023-10-25 SURGICAL SUPPLY — 59 items
BIT DRILL 2.4 AO COUPLING CANN (BIT) IMPLANT
BIT DRILL 2.5X2.75 QC CALB (BIT) IMPLANT
BLADE SURG 15 STRL LF DISP TIS (BLADE) ×4 IMPLANT
BNDG COHESIVE 4X5 TAN STRL LF (GAUZE/BANDAGES/DRESSINGS) IMPLANT
BNDG ELASTIC 6X10 VLCR STRL LF (GAUZE/BANDAGES/DRESSINGS) ×1 IMPLANT
BNDG GAUZE DERMACEA FLUFF 4 (GAUZE/BANDAGES/DRESSINGS) ×1 IMPLANT
CANISTER SUCT 1200ML W/VALVE (MISCELLANEOUS) IMPLANT
CHLORAPREP W/TINT 26 (MISCELLANEOUS) ×1 IMPLANT
COVER BACK TABLE 60X90IN (DRAPES) ×1 IMPLANT
CUFF TRNQT CYL 34X4.125X (TOURNIQUET CUFF) ×1 IMPLANT
DRAPE C-ARM 42X72 X-RAY (DRAPES) ×1 IMPLANT
DRAPE C-ARMOR (DRAPES) ×1 IMPLANT
DRAPE EXTREMITY T 121X128X90 (DISPOSABLE) ×1 IMPLANT
DRAPE IMP U-DRAPE 54X76 (DRAPES) ×1 IMPLANT
DRAPE U-SHAPE 47X51 STRL (DRAPES) ×1 IMPLANT
DRIVER RETENTION T15 LONG (ORTHOPEDIC DISPOSABLE SUPPLIES) IMPLANT
DRSG MEPITEL 4X7.2 (GAUZE/BANDAGES/DRESSINGS) ×1 IMPLANT
ELECTRODE REM PT RTRN 9FT ADLT (ELECTROSURGICAL) ×1 IMPLANT
GAUZE PAD ABD 8X10 STRL (GAUZE/BANDAGES/DRESSINGS) ×1 IMPLANT
GAUZE SPONGE 4X4 12PLY STRL (GAUZE/BANDAGES/DRESSINGS) ×1 IMPLANT
GLOVE BIOGEL PI IND STRL 8 (GLOVE) ×1 IMPLANT
GLOVE SURG SS PI 7.5 STRL IVOR (GLOVE) ×2 IMPLANT
GOWN STRL REUS W/ TWL LRG LVL3 (GOWN DISPOSABLE) ×2 IMPLANT
KWIRE ALPS MXV 1.6X6 ZI (WIRE) IMPLANT
KWIRE TROC 1.25X150 (WIRE) IMPLANT
MARKER SKIN DUAL TIP RULER LAB (MISCELLANEOUS) IMPLANT
NDL HYPO 25X1 1.5 SAFETY (NEEDLE) IMPLANT
NEEDLE HYPO 25X1 1.5 SAFETY (NEEDLE) IMPLANT
NS IRRIG 1000ML POUR BTL (IV SOLUTION) ×1 IMPLANT
PACK BASIN DAY SURGERY FS (CUSTOM PROCEDURE TRAY) ×1 IMPLANT
PADDING CAST SYNTHETIC 4X4 STR (CAST SUPPLIES) ×3 IMPLANT
PADDING CAST SYNTHETIC 6X4 NS (CAST SUPPLIES) ×2 IMPLANT
PENCIL SMOKE EVACUATOR (MISCELLANEOUS) ×1 IMPLANT
PLATE TUB 1/3 8H (Plate) IMPLANT
SCREW CANN PT 4X44 NS (Screw) IMPLANT
SCREW CANN PT 50X4 NS SM (Screw) IMPLANT
SCREW CANNULATED PT 4.0X44 (Screw) ×1 IMPLANT
SCREW LOCK MDS 3.5X14 (Screw) IMPLANT
SCREW LOCKING MDS 3.5X65 (Screw) IMPLANT
SCREW NLOCK 4X60 (Plate) IMPLANT
SCREW NLOCK ALPS 3.5X18 (Screw) IMPLANT
SCREW NON-LOCK 3.5X16 (Screw) IMPLANT
SHEET MEDIUM DRAPE 40X70 STRL (DRAPES) ×1 IMPLANT
SLEEVE SCD COMPRESS KNEE MED (STOCKING) ×1 IMPLANT
SPIKE FLUID TRANSFER (MISCELLANEOUS) IMPLANT
SPLINT FIBERGLASS 4X30 (CAST SUPPLIES) ×2 IMPLANT
SPONGE T-LAP 18X18 ~~LOC~~+RFID (SPONGE) ×1 IMPLANT
STAPLER SKIN PROX WIDE 3.9 (STAPLE) ×1 IMPLANT
SUCTION TUBE FRAZIER 10FR DISP (SUCTIONS) IMPLANT
SUT ETHILON 2 0 FS 18 (SUTURE) ×2 IMPLANT
SUT MNCRL AB 3-0 PS2 18 (SUTURE) IMPLANT
SUT PDS II 3-0 CT2 27 ABS (SUTURE) IMPLANT
SUT VIC AB 0 CT1 27XBRD ANBCTR (SUTURE) IMPLANT
SUT VIC AB 2-0 CT1 TAPERPNT 27 (SUTURE) ×1 IMPLANT
SYR BULB IRRIG 60ML STRL (SYRINGE) ×1 IMPLANT
SYR CONTROL 10ML LL (SYRINGE) IMPLANT
TOWEL GREEN STERILE FF (TOWEL DISPOSABLE) ×2 IMPLANT
TUBE CONNECTING 20X1/4 (TUBING) IMPLANT
UNDERPAD 30X36 HEAVY ABSORB (UNDERPADS AND DIAPERS) ×1 IMPLANT

## 2023-10-25 NOTE — Op Note (Signed)
 10/25/2023  4:42 PM   PATIENT: Jason Mullins  73 y.o. male  MRN: [id number]   PRE-OPERATIVE DIAGNOSIS:   Closed trimalleolar fracture of right ankle, initial encounter   POST-OPERATIVE DIAGNOSIS:   Closed trimalleolar fracture of right ankle, initial encounter   PROCEDURE: 1] Right trimalleolar ankle ORIF with internal fixation of posterior malleolus 2] Right ankle syndesmosis ORIF   SURGEON:  Lillia Mountain, MD   ASSISTANT: None   ANESTHESIA: General, regional   EBL: Minimal   TOURNIQUET:    Total Tourniquet Time Documented: Thigh (Right) - 69 minutes Total: Thigh (Right) - 69 minutes    COMPLICATIONS: None apparent   DISPOSITION: Extubated, awake and stable to recovery.   INDICATION FOR PROCEDURE: The patient presented with above diagnosis.  We discussed the diagnosis, alternative treatment options, risks and benefits of the above surgical intervention, as well as alternative non-operative treatments. All questions/concerns were addressed and the patient/family demonstrated appropriate understanding of the diagnosis, the procedure, the postoperative course, and overall prognosis. The patient wished to proceed with surgical intervention and signed an informed surgical consent as such, in each others presence prior to surgery.   PROCEDURE IN DETAIL: After preoperative consent was obtained and the correct operative site was identified, the patient was brought to the operating room supine on stretcher and transferred onto operating table. General anesthesia was induced. Preoperative antibiotics were administered. Surgical timeout was taken. The patient was then positioned supine with an ipsilateral hip bump. The operative lower extremity was prepped and draped in standard sterile fashion with a tourniquet around the thigh. The extremity was exsanguinated and the tourniquet was inflated to 275 mmHg.  A standard lateral incision was made over the distal fibula.  Dissection was carried down to the level of the fibula and the fracture site identified. The superficial peroneal nerve was identified and protected throughout the procedure. The fibula was noted to be shortened with interposed periosteum. The fibula was brought out to length. The fibula fracture was debrided and the edges defined to achieve cortical read. Reduction maneuver was performed using pointed reduction forceps and lobster forceps. In this manner, the fibula length was restored and fracture reduced. A lag screw was not placed given the orientation of fracture lines and comminution. Due to poor bone quality and extensive comminution at the fracture site, it was decided to use a locking distal fibula plate. We then selected a Zimmer locking plate to match the anatomy of the distal fibula and placed it laterally. This was implanted under intraoperative fluoroscopy with a combination of distal locking screws and proximal cortical & locking screws.  We then turned to the medial malleolar fracture. After obtaining reduction under fluoroscopy, a Kirschner wire was placed to secure this reduction and to serve as guide for a cannulated screw. We then made an incision around the wire and overdrilled this with a cannulated drill. We then placed a 4.0 mm Zimmer [employer] partially threaded lag screw. This screw was noted to achieve excellent compression across the fracture site and also have excellent purchase. We verified position of the screw and fracture reduction in all planes with fluoroscopy.  We then proceeded with reduction and fixation of the posterior distal tibia fragment. We confirmed appropriate reduction using intraoperative fluoroscopy. The posterior distal tibia fragment was reduced directly using window thru the fibula fracture. An anterolateral approach was made over anterior ankle. Dissection carried down to level of distal tibia taking care throughout to protect the nearby tendons and neurovascular  structures. A  Kirschner wire was used to provisionally fix the fracture. This was overdrilled with cannulated drill and a 4.0 partially threaded screw was implanted. This was noted to achieve compression across the fracture with excellent congruency of the tibial articular surface on fluoroscopy. Position of this screw was verified carefully using intraoperative fluoroscopy throughout.    A manual external rotation stress radiograph was obtained and demonstrated widening of the ankle mortise. Given this intraoperative finding as well as preoperative subluxation, it was decided to reduce and fix the syndesmosis. Therefore a nonlocking quadricortical hex head 4.0 mm screw was implanted through the fibula plate in appropriate fashion to fix the syndesmosis. Screw position was verified along anteromedial tibial cortex by fluoroscopy. A repeat stress radiograph showed complete stability of the ankle mortise to testing. A second locking 3.5 screw was implanted to reinforce fixation.  The surgical sites were thoroughly irrigated. The tourniquet was deflated and hemostasis achieved. Betadine and vancomycin powder were applied. The deep layers were closed using 2-0 vicryl. The skin was closed without tension.    The leg was cleaned with saline and sterile dressings with gauze were applied. A well padded bulky short leg splint was applied. The patient was awakened from anesthesia and transported to the recovery room in stable condition.    FOLLOW UP PLAN: -transfer to PACU, then home -strict NWB operative extremity, maximum elevation -maintain short leg splint until follow up -DVT ppx: Aspirin 81 mg twice daily while NWB -follow up as outpatient within 7-10 days for wound check with exchange of short leg splint to short leg cast -sutures out in 2-3 weeks in outpatient office   RADIOGRAPHS: AP, lateral, oblique and stress radiographs of the right ankle were obtained intraoperatively. These showed interval  reduction and fixation of the fractures. Manual stress radiographs were taken and the joints were noted to be stable following fixation. All hardware is appropriately positioned and of the appropriate lengths. No other acute injuries are noted.   Lillia Mountain [facility]

## 2023-10-25 NOTE — Progress Notes (Signed)
Assisted Dr. Lanetta Inch with right, popliteal/saphenous, ultrasound guided block. Side rails up, monitors on throughout procedure. See vital signs in flow sheet. Tolerated Procedure well.

## 2023-10-25 NOTE — Anesthesia Procedure Notes (Signed)
 Anesthesia Regional Block: Adductor canal block   Pre-Anesthetic Checklist: , timeout performed,  Correct Patient, Correct Site, Correct Laterality,  Correct Procedure, Correct Position, site marked,  Risks and benefits discussed,  Pre-op evaluation,  At surgeon's request and post-op pain management  Laterality: Right  Prep: Maximum Sterile Barrier Precautions used, chloraprep       Needles:  Injection technique: Single-shot  Needle Type: Echogenic Stimulator Needle     Needle Length: 9cm  Needle Gauge: 21     Additional Needles:   Procedures:,,,, ultrasound used (permanent image in chart),,    Narrative:  Start time: 10/25/2023 2:31 PM End time: 10/25/2023 2:33 PM Injection made incrementally with aspirations every 5 mL. Anesthesiologist: Niels Marien CROME, MD

## 2023-10-25 NOTE — Anesthesia Procedure Notes (Addendum)
 Procedure Name: LMA Insertion Date/Time: 10/25/2023 2:51 PM  Performed by: Denton Niels CROME, CRNAPre-anesthesia Checklist: Patient identified, Emergency Drugs available, Suction available, Patient being monitored and Timeout performed Patient Re-evaluated:Patient Re-evaluated prior to induction Oxygen Delivery Method: Circle system utilized Preoxygenation: Pre-oxygenation with 100% oxygen Induction Type: IV induction Ventilation: Mask ventilation without difficulty LMA: LMA inserted LMA Size: 5.0 Number of attempts: 1 Placement Confirmation: positive ETCO2 Dental Injury: Teeth and Oropharynx as per pre-operative assessment

## 2023-10-25 NOTE — Anesthesia Procedure Notes (Signed)
 Anesthesia Regional Block: Popliteal block   Pre-Anesthetic Checklist: , timeout performed,  Correct Patient, Correct Site, Correct Laterality,  Correct Procedure, Correct Position, site marked,  Risks and benefits discussed,  Pre-op evaluation,  At surgeon's request and post-op pain management  Laterality: Right  Prep: Maximum Sterile Barrier Precautions used, chloraprep       Needles:  Injection technique: Single-shot  Needle Type: Echogenic Stimulator Needle     Needle Length: 9cm  Needle Gauge: 21     Additional Needles:   Procedures:,,,, ultrasound used (permanent image in chart),,    Narrative:  Start time: 10/25/2023 2:28 PM End time: 10/25/2023 2:31 PM Injection made incrementally with aspirations every 5 mL. Anesthesiologist: Niels Marien CROME, MD

## 2023-10-25 NOTE — Anesthesia Preprocedure Evaluation (Addendum)
 Anesthesia Evaluation  Patient identified by MRN, date of birth, ID band Patient awake    Reviewed: Allergy & Precautions, NPO status , Patient's Chart, lab work & pertinent test results  Airway Mallampati: I  TM Distance: >3 FB Neck ROM: Full    Dental no notable dental hx. (+) Teeth Intact, Dental Advisory Given   Pulmonary sleep apnea , former smoker   Pulmonary exam normal breath sounds clear to auscultation       Cardiovascular hypertension, Pt. on medications Normal cardiovascular exam Rhythm:Regular Rate:Normal     Neuro/Psych negative neurological ROS  negative psych ROS   GI/Hepatic negative GI ROS,,,(+) Hepatitis -, C  Endo/Other  negative endocrine ROS    Renal/GU negative Renal ROS  negative genitourinary   Musculoskeletal  (+) Arthritis ,    Abdominal   Peds  Hematology negative hematology ROS (+)   Anesthesia Other Findings   Reproductive/Obstetrics                              Anesthesia Physical Anesthesia Plan  ASA: 3  Anesthesia Plan: General and Regional   Post-op Pain Management: Regional block* and Tylenol PO (pre-op)*   Induction: Intravenous  PONV Risk Score and Plan: 2 and Ondansetron , Dexamethasone and Treatment may vary due to age or medical condition  Airway Management Planned: LMA  Additional Equipment:   Intra-op Plan:   Post-operative Plan: Extubation in OR  Informed Consent: I have reviewed the patients History and Physical, chart, labs and discussed the procedure including the risks, benefits and alternatives for the proposed anesthesia with the patient or authorized representative who has indicated his/her understanding and acceptance.     Dental advisory given  Plan Discussed with: CRNA  Anesthesia Plan Comments:          Anesthesia Quick Evaluation

## 2023-10-25 NOTE — H&P (Signed)

## 2023-10-25 NOTE — Transfer of Care (Signed)
 Immediate Anesthesia Transfer of Care Note  Patient: Jason Mullins  Procedure(s) Performed: OPEN REDUCTION INTERNAL FIXATION (ORIF) ANKLE FRACTURE (Right: Ankle) REPAIR, SYNDESMOSIS, ANKLE (Right: Ankle) EXTERNAL FIXATION, ANKLE (Right: Ankle)  Patient Location: PACU  Anesthesia Type:GA combined with regional for post-op pain  Level of Consciousness: awake, alert , oriented, and patient cooperative  Airway & Oxygen Therapy: Patient Spontanous Breathing  Post-op Assessment: Report given to RN and Post -op Vital signs reviewed and stable  Post vital signs: Reviewed and stable  Last Vitals:  Vitals Value Taken Time  BP 151/99 10/25/23 16:32  Temp 36.8 C 10/25/23 16:32  Pulse 58 10/25/23 16:39  Resp 14 10/25/23 16:39  SpO2 96 % 10/25/23 16:39  Vitals shown include unfiled device data.  Last Pain:  Vitals:   10/25/23 1632  TempSrc:   PainSc: 0-No pain      Patients Stated Pain Goal: 3 (10/25/23 1357)  Complications: No notable events documented.

## 2023-10-27 NOTE — Anesthesia Postprocedure Evaluation (Signed)
 Anesthesia Post Note  Patient: Jason Mullins  Procedure(s) Performed: OPEN REDUCTION INTERNAL FIXATION (ORIF) ANKLE FRACTURE (Right: Ankle) REPAIR, SYNDESMOSIS, ANKLE (Right: Ankle) EXTERNAL FIXATION, ANKLE (Right: Ankle)     Patient location during evaluation: PACU Anesthesia Type: Regional and General Level of consciousness: awake and alert Pain management: pain level controlled Vital Signs Assessment: post-procedure vital signs reviewed and stable Respiratory status: spontaneous breathing, nonlabored ventilation, respiratory function stable and patient connected to nasal cannula oxygen Cardiovascular status: blood pressure returned to baseline and stable Postop Assessment: no apparent nausea or vomiting Anesthetic complications: no   No notable events documented.  Last Vitals:  Vitals:   10/25/23 1645 10/25/23 1731  BP: (!) 154/87 (!) 161/95  Pulse: 62 78  Resp: 14 16  Temp:  36.5 C  SpO2: 97% 100%    Last Pain:  Vitals:   10/26/23 1026  TempSrc:   PainSc: 0-No pain                 [name] [name]

## 2023-10-30 ENCOUNTER — Encounter (HOSPITAL_BASED_OUTPATIENT_CLINIC_OR_DEPARTMENT_OTHER): Payer: Self-pay | Admitting: Orthopaedic Surgery

## 2023-11-02 DIAGNOSIS — S82851D Displaced trimalleolar fracture of right lower leg, subsequent encounter for closed fracture with routine healing: Secondary | ICD-10-CM | POA: Diagnosis not present

## 2023-11-02 DIAGNOSIS — S82851A Displaced trimalleolar fracture of right lower leg, initial encounter for closed fracture: Secondary | ICD-10-CM | POA: Diagnosis not present

## 2023-11-07 ENCOUNTER — Encounter (HOSPITAL_BASED_OUTPATIENT_CLINIC_OR_DEPARTMENT_OTHER): Payer: Self-pay | Admitting: Orthopaedic Surgery

## 2023-11-16 DIAGNOSIS — S82851D Displaced trimalleolar fracture of right lower leg, subsequent encounter for closed fracture with routine healing: Secondary | ICD-10-CM | POA: Diagnosis not present

## 2023-11-22 ENCOUNTER — Telehealth: Payer: Self-pay | Admitting: Family Medicine

## 2023-11-22 NOTE — Telephone Encounter (Signed)
Got him scheduled

## 2023-11-22 NOTE — Telephone Encounter (Signed)
 Jannis- Pt is overdue for an appt w/ Dr. Watt, can you try reaching out to schedule appt please? Thank you.

## 2023-11-28 NOTE — Progress Notes (Addendum)
 [facility], [employer] [facility] [employer] [address], [address] [place], [place] [address] [phone]  Date:  11/29/2023   Name:  Jason Mullins   DOB:  December 16, 1950   MRN:  [id number]  PCP:  Watt Harlene BROCKS, MD    Chief Complaint: Annual Exam   History of Present Illness:  Jason Mullins is a 73 y.o. very pleasant male patient who presents with the following:  Patient seen today for Medicare follow-up.  I saw him most recently 1 year ago History of HTN, OSA, hep C s/p curative treatment with harvoni  When I saw him last year he had lost about 15 pounds- weight loss was somewhat intentional but also seemed easier than expected.  I was concerned and recommended weight loss workup. We got a plain chest x-ray and I ordered a CT of his abdomen pelvis but this was not completed-I was not able to get Medicare to approve a CT chest at that time  Unfortunately this fall he suffered a severe right ankle fracture which occurred in September-he stepped in a hole while walking He had operative repair per orthopedics on October 1- Dr Barton with [facility]  Amlodipine  5 Losartan  100 Recommend flu shot- declines  Recommend pneumonia vaccine-declines  Recommend Shingrix Recommend COVID-vaccine Need to update blood work There was a question last year about when he was meant to follow-up with his GI for repeat colonoscopy His last colon was done per [facility] in [place] he thinks   Wt Readings from Last 3 Encounters:  11/29/23 194 lb 9.6 oz (88.3 kg)  10/25/23 192 lb 14.4 oz (87.5 kg)  10/21/23 200 lb (90.7 kg)     Discussed the use of AI scribe software for clinical note transcription with the patient, who gave verbal consent to proceed.  History of Present Illness Jason Mullins is a 73 year old male who presents with ankle issues and medication management.  He has ongoing issues with his ankle, describing it as a 'funny joint' with complex stability  concerns. He is worried about his ability to rehabilitate it effectively. He is currently taking doxycycline  twice a day as a preventive measure against potential infections related to his ankle.  He is on losartan  and amlodipine  for hypertension. He mentions running low on losartan  and has been taking two 5 mg doses of amlodipine , although it was previously determined that he should be on a 10 mg dose.  Aside from his ankle issues, he feels well overall.    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   Benign essential HTN 01/30/2014   Hepatitis C antibody test positive 01/30/2014   History of Sleep apnea 01/30/2014    Past Medical History:  Diagnosis Date   Arthritis    bil shoulders   History of hepatitis C    Cured   Hypertension    Sleep apnea     Past Surgical History:  Procedure Laterality Date   COLONOSCOPY     CORRECTION HAMMER TOE     bil   EXTERNAL FIXATION, ANKLE Right 10/25/2023   Procedure: EXTERNAL FIXATION, ANKLE;  Surgeon: Barton Drape, MD;  Location: [facility];  Service: Orthopedics;  Laterality: Right;  possible external fixation   ORIF ANKLE FRACTURE Right 10/25/2023   Procedure: OPEN REDUCTION INTERNAL FIXATION (ORIF) ANKLE FRACTURE;  Surgeon: Barton Drape, MD;  Location: [facility];  Service: Orthopedics;  Laterality: Right;  right ankle open reduction internal fixation of fracture , possible allograft,   SYNDESMOSIS REPAIR Right 10/25/2023   Procedure: REPAIR, SYNDESMOSIS, ANKLE;  Surgeon: Barton Drape, MD;  Location: [facility];  Service: Orthopedics;  Laterality: Right;  possible syndesmosis and/or deltoid fixation    Social History   Tobacco Use   Smoking status: Former    Types: Cigars   Smokeless tobacco: Never   Tobacco comments:    1 cigar a month  Vaping Use   Vaping status: Never Used  Substance Use Topics   Alcohol use: Not Currently     Alcohol/week: 1.0 standard drink of alcohol    Types: 1 Cans of beer per week   Drug use: No    Family History  Problem Relation Age of Onset   Cancer Mother    Cancer Father    Cancer Sister     No Known Allergies  Medication list has been reviewed and updated.  Current Outpatient Medications on File Prior to Visit  Medication Sig Dispense Refill   acetaminophen  (TYLENOL ) 500 MG tablet Take 1,000 mg by mouth every 6 (six) hours as needed.     Blood Pressure Monitoring (B-D ASSURE BPM/AUTO ARM CUFF) MISC 1 Device by Does not apply route daily as needed. Use to check BP as needed 1 each 0   Multiple Vitamin (MULTIVITAMIN WITH MINERALS) TABS tablet Take 1 tablet by mouth daily.     triamcinolone  cream (KENALOG ) 0.5 % Apply 1 Application topically 2 (two) times daily. Use as needed for eczema 90 g 2   oxyCODONE  (ROXICODONE ) 5 MG immediate release tablet Take 1 tablet (5 mg total) by mouth every 6 (six) hours as needed for up to 10 doses. (Patient not taking: Reported on 11/29/2023) 10 tablet 0   No current facility-administered medications on file prior to visit.    Review of Systems:  As per HPI- otherwise negative.   Physical Examination: Vitals:   11/29/23 1350  BP: (!) 140/74  Pulse: 67  Temp: 99 F (37.2 C)  SpO2: 99%   Vitals:   11/29/23 1350  Weight: 194 lb 9.6 oz (88.3 kg)  Height: 6' (1.829 m)   Body mass index is 26.39 kg/m. Ideal Body Weight: Weight in (lb) to have BMI = 25: 183.9  GEN: no acute distress.  Tall slim build, looks well HEENT: Atraumatic, Normocephalic.  Ears and Nose: No external deformity. CV: RRR, No M/G/R. No JVD. No thrill. No extra heart sounds. PULM: CTA B, no wheezes, crackles, rhonchi. No retractions. No resp. distress. No accessory muscle use. ABD: S, NT, ND, EXTR: No c/c/e PSYCH: Normally interactive. Conversant.  Using a knee scooter and right ankle is in a cast  Assessment and Plan: Screening for hyperlipidemia - Plan:  Lipid panel  Screening for prostate cancer - Plan: PSA, Medicare ( [facility] only)  Essential hypertension - Plan: CBC, Comprehensive metabolic panel with GFR, losartan  (COZAAR ) 100 MG tablet, amLODipine  (NORVASC ) 10 MG tablet  Screening for diabetes mellitus - Plan: Comprehensive metabolic panel with GFR, Hemoglobin A1c  Screening for colon cancer - Plan: Ambulatory referral to Gastroenterology  Unintended weight loss - Plan: CT ABDOMEN PELVIS W CONTRAST  Assessment & Plan Benign essential hypertension Hypertension managed with amlodipine  and losartan . Amlodipine  10 mg daily with two 5 mg tablets. Low on medications. - Refill losartan  prescription, [facility] on [address]. - Adjust amlodipine  prescription to 10 mg daily, [facility] on  [address].  General Health Maintenance Referral to gastroenterologist planned, acceptable to wait until more mobile.  - Place referral to gastroenterologist with [facility]. - Arrange blood work.  Unintended weight loss, set up CT scan of abdomen pelvis Signed Harlene Schroeder, MD  Addendum 11/6, received labs as below.  Message to patient  Results for orders placed or performed in visit on 11/29/23  CBC   Collection Time: 11/29/23  2:09 PM  Result Value Ref Range   WBC 4.9 4.0 - 10.5 K/uL   RBC 4.01 (L) 4.22 - 5.81 Mil/uL   Platelets 222.0 150.0 - 400.0 K/uL   Hemoglobin 12.3 (L) 13.0 - 17.0 g/dL   HCT 63.7 (L) 60.9 - 47.9 %   MCV 90.3 78.0 - 100.0 fl   MCHC 33.9 30.0 - 36.0 g/dL   RDW 85.0 88.4 - 84.4 %  Comprehensive metabolic panel with GFR   Collection Time: 11/29/23  2:09 PM  Result Value Ref Range   Sodium 139 135 - 145 mEq/L   Potassium 4.2 3.5 - 5.1 mEq/L   Chloride 104 96 - 112 mEq/L   CO2 25 19 - 32 mEq/L   Glucose, Bld 84 70 - 99 mg/dL   BUN 19 6 - 23 mg/dL   Creatinine, Ser 9.12 0.40 - 1.50 mg/dL   Total Bilirubin 0.5 0.2 - 1.2 mg/dL   Alkaline Phosphatase 64 39 - 117 U/L   AST 15 0 -  37 U/L   ALT 12 0 - 53 U/L   Total Protein 7.2 6.0 - 8.3 g/dL   Albumin 4.2 3.5 - 5.2 g/dL   GFR 14.86 >39.99 mL/min   Calcium 10.0 8.4 - 10.5 mg/dL  Hemoglobin J8r   Collection Time: 11/29/23  2:09 PM  Result Value Ref Range   Hgb A1c MFr Bld 5.4 4.6 - 6.5 %  Lipid panel   Collection Time: 11/29/23  2:09 PM  Result Value Ref Range   Cholesterol 177 0 - 200 mg/dL   Triglycerides 28.9 0.0 - 149.0 mg/dL   HDL 33.99 >60.99 mg/dL   VLDL 85.7 0.0 - 59.9 mg/dL   LDL Cholesterol 97 0 - 99 mg/dL   Total CHOL/HDL Ratio 3    NonHDL 111.11   PSA, Medicare ( [facility] only)   Collection Time: 11/29/23  2:09 PM  Result Value Ref Range   PSA 0.98 0.10 - 4.00 ng/ml    "

## 2023-11-28 NOTE — Patient Instructions (Incomplete)
 It was good to see you today, I am sorry that you had this bad ankle fracture!  I will be in touch with you labs -referral back to GI to update your colonoscopy -ordered CT abdomen and pelvis regarding weight loss Changed your amlodipine  back to 10 mg Recommend flu, pneumonia, shingles vaccines

## 2023-11-29 ENCOUNTER — Encounter: Payer: Self-pay | Admitting: Family Medicine

## 2023-11-29 ENCOUNTER — Ambulatory Visit (INDEPENDENT_AMBULATORY_CARE_PROVIDER_SITE_OTHER): Admitting: Family Medicine

## 2023-11-29 VITALS — BP 140/74 | HR 67 | Temp 99.0°F | Ht 72.0 in | Wt 194.6 lb

## 2023-11-29 DIAGNOSIS — Z131 Encounter for screening for diabetes mellitus: Secondary | ICD-10-CM | POA: Diagnosis not present

## 2023-11-29 DIAGNOSIS — I10 Essential (primary) hypertension: Secondary | ICD-10-CM

## 2023-11-29 DIAGNOSIS — Z1211 Encounter for screening for malignant neoplasm of colon: Secondary | ICD-10-CM | POA: Diagnosis not present

## 2023-11-29 DIAGNOSIS — Z125 Encounter for screening for malignant neoplasm of prostate: Secondary | ICD-10-CM | POA: Diagnosis not present

## 2023-11-29 DIAGNOSIS — Z13220 Encounter for screening for lipoid disorders: Secondary | ICD-10-CM

## 2023-11-29 DIAGNOSIS — D649 Anemia, unspecified: Secondary | ICD-10-CM

## 2023-11-29 DIAGNOSIS — R634 Abnormal weight loss: Secondary | ICD-10-CM | POA: Diagnosis not present

## 2023-11-29 MED ORDER — LOSARTAN POTASSIUM 100 MG PO TABS: 100.0000 mg | ORAL_TABLET | Freq: Every day | ORAL | 0 refills | Status: AC

## 2023-11-29 MED ORDER — AMLODIPINE BESYLATE 10 MG PO TABS: 10.0000 mg | ORAL_TABLET | Freq: Every day | ORAL | 3 refills | Status: AC

## 2023-11-30 ENCOUNTER — Encounter: Payer: Self-pay | Admitting: Family Medicine

## 2023-11-30 LAB — COMPREHENSIVE METABOLIC PANEL WITH GFR
ALT: 12 U/L (ref 0–53)
AST: 15 U/L (ref 0–37)
Albumin: 4.2 g/dL (ref 3.5–5.2)
Alkaline Phosphatase: 64 U/L (ref 39–117)
BUN: 19 mg/dL (ref 6–23)
CO2: 25 meq/L (ref 19–32)
Calcium: 10 mg/dL (ref 8.4–10.5)
Chloride: 104 meq/L (ref 96–112)
Creatinine, Ser: 0.87 mg/dL (ref 0.40–1.50)
GFR: 85.13 mL/min (ref >60.00)
Glucose, Bld: 84 mg/dL (ref 70–99)
Potassium: 4.2 meq/L (ref 3.5–5.1)
Sodium: 139 meq/L (ref 135–145)
Total Bilirubin: 0.5 mg/dL (ref 0.2–1.2)
Total Protein: 7.2 g/dL (ref 6.0–8.3)

## 2023-11-30 LAB — LIPID PANEL
Cholesterol: 177 mg/dL (ref 0–200)
HDL: 66 mg/dL (ref >39.00)
LDL Cholesterol: 97 mg/dL (ref 0–99)
NonHDL: 111.11
Total CHOL/HDL Ratio: 3
Triglycerides: 71 mg/dL (ref 0.0–149.0)
VLDL: 14.2 mg/dL (ref 0.0–40.0)

## 2023-11-30 LAB — CBC
HCT: 36.2 % — ABNORMAL LOW (ref 39.0–52.0)
Hemoglobin: 12.3 g/dL — ABNORMAL LOW (ref 13.0–17.0)
MCHC: 33.9 g/dL (ref 30.0–36.0)
MCV: 90.3 fl (ref 78.0–100.0)
Platelets: 222 K/uL (ref 150.0–400.0)
RBC: 4.01 Mil/uL — ABNORMAL LOW (ref 4.22–5.81)
RDW: 14.9 % (ref 11.5–15.5)
WBC: 4.9 K/uL (ref 4.0–10.5)

## 2023-11-30 LAB — HEMOGLOBIN A1C: Hgb A1c MFr Bld: 5.4 % (ref 4.6–6.5)

## 2023-11-30 LAB — PSA, MEDICARE: PSA: 0.98 ng/mL (ref 0.10–4.00)

## 2023-11-30 NOTE — Addendum Note (Signed)
 Addended by: WATT RAISIN C on: 11/30/2023 04:19 PM   Modules accepted: Orders

## 2023-12-01 ENCOUNTER — Telehealth (HOSPITAL_BASED_OUTPATIENT_CLINIC_OR_DEPARTMENT_OTHER): Payer: Self-pay

## 2023-12-02 ENCOUNTER — Ambulatory Visit (HOSPITAL_BASED_OUTPATIENT_CLINIC_OR_DEPARTMENT_OTHER)
Admission: RE | Admit: 2023-12-02 | Discharge: 2023-12-02 | Disposition: A | Source: Ambulatory Visit | Attending: Family Medicine | Admitting: Family Medicine

## 2023-12-02 DIAGNOSIS — R634 Abnormal weight loss: Secondary | ICD-10-CM | POA: Insufficient documentation

## 2023-12-02 DIAGNOSIS — N400 Enlarged prostate without lower urinary tract symptoms: Secondary | ICD-10-CM | POA: Diagnosis not present

## 2023-12-02 MED ORDER — IOHEXOL 300 MG/ML  SOLN
100.0000 mL | Freq: Once | INTRAMUSCULAR | Status: AC | PRN
  Administered 2023-12-02: 75 mL via INTRAVENOUS

## 2023-12-05 ENCOUNTER — Encounter: Payer: Self-pay | Admitting: Family Medicine

## 2023-12-05 DIAGNOSIS — R634 Abnormal weight loss: Secondary | ICD-10-CM

## 2023-12-07 DIAGNOSIS — S82851A Displaced trimalleolar fracture of right lower leg, initial encounter for closed fracture: Secondary | ICD-10-CM | POA: Diagnosis not present

## 2023-12-28 ENCOUNTER — Encounter: Payer: Self-pay | Admitting: Family Medicine

## 2023-12-28 DIAGNOSIS — S82851A Displaced trimalleolar fracture of right lower leg, initial encounter for closed fracture: Secondary | ICD-10-CM | POA: Diagnosis not present

## 2024-02-14 ENCOUNTER — Other Ambulatory Visit: Payer: Self-pay

## 2024-02-14 ENCOUNTER — Encounter (HOSPITAL_BASED_OUTPATIENT_CLINIC_OR_DEPARTMENT_OTHER): Payer: Self-pay | Admitting: Orthopaedic Surgery

## 2024-02-14 NOTE — Progress Notes (Signed)
" °   02/14/24 1133  PAT Phone Screen  Is the patient taking a GLP-1 receptor agonist? No  Do You Have Diabetes? No  Do You Have Hypertension? (S)  Yes  Have You Ever Been to the ER for Asthma? No  Have You Taken Oral Steroids in the Past 3 Months? No  Do you Take Phenteramine or any Other Diet Drugs? No  Recent  Lab Work, EKG, CXR? (S)  Yes (EKG)  Where was this test performed? [facility]  Do you have a history of heart problems? No  Any Recent Hospitalizations? No  Height 6' 4 (1.93 m)  Weight 95.3 kg  Pat Appointment Scheduled No  Reason for No Appointment Not Needed    "

## 2024-02-19 NOTE — Discharge Instructions (Signed)
 Lillia Mountain, MD [facility]  Please read the following information regarding your care after surgery.  Medications   - Oxycodone  5 mg every 6 hours as needed for pain - Aspirin 81 mg twice daily as scheduled to prevent blood clots - Colace 100 mg twice daily as needed for constipation - Zofran  4 mg every 8 hours as needed for nausea/vomiting  We send above prescriptions to your pharmacy on file.  In addition you may also use: ? acetaminophen  (Tylenol ) 500 mg every 4-6 hours as you need for minor to moderate pain  Resume all other routine medications per usual or as directed by your PCP/other specialists.  ? To help prevent blood clots, you should also get up every hour while you are awake to move around.  Weight Bearing ? OK to walk on the operative leg only AFTER the nerve block has completely worn off.  Cast / Splint / Dressing ? Keep your dressing clean and dry.  Don't put anything (coat hanger, pencil, etc) down inside of it.  If it gets wet, please notify the office immediately.  Swelling IMPORTANT: It is normal for you to have swelling where you had surgery. To reduce swelling and pain, keep at least 3 pillows under your leg so that your toes are above your nose and your heel is above the level of your hip.  It may be necessary to keep your foot or leg elevated for several weeks.  This is critical to helping your incisions heal and your pain to feel better.  Follow Up Call my office at [phone] when you are discharged from the hospital or surgery center to schedule an appointment to be seen within 7-10 days after surgery.  Call my office at [phone] if you develop a fever >101.5 F, nausea, vomiting, bleeding from the surgical site or severe pain.

## 2024-02-19 NOTE — H&P (Signed)
 ORTHOPAEDIC SURGERY H&P  Subjective:  The patient presents for right ankle removal of deep hardware (syndesmosis fixation) and possible revision dynamic syndesmosis fixation.   Past Medical History:  Diagnosis Date   Arthritis    bil shoulders   History of hepatitis C    Cured   Hypertension    Sleep apnea     Past Surgical History:  Procedure Laterality Date   COLONOSCOPY     CORRECTION HAMMER TOE     bil   EXTERNAL FIXATION, ANKLE Right 10/25/2023   Procedure: EXTERNAL FIXATION, ANKLE;  Surgeon: Barton Drape, MD;  Location: [facility];  Service: Orthopedics;  Laterality: Right;  possible external fixation   ORIF ANKLE FRACTURE Right 10/25/2023   Procedure: OPEN REDUCTION INTERNAL FIXATION (ORIF) ANKLE FRACTURE;  Surgeon: Barton Drape, MD;  Location: [facility];  Service: Orthopedics;  Laterality: Right;  right ankle open reduction internal fixation of fracture , possible allograft,   SYNDESMOSIS REPAIR Right 10/25/2023   Procedure: REPAIR, SYNDESMOSIS, ANKLE;  Surgeon: Barton Drape, MD;  Location: [facility];  Service: Orthopedics;  Laterality: Right;  possible syndesmosis and/or deltoid fixation     Show/hide medication list[1]   Allergies[2]  Social History   Socioeconomic History   Marital status: Married    Spouse name: Neville   Number of children: 2   Years of education: Not on file   Highest education level: Master's degree (e.g., MA, MS, MEng, MEd, MSW, MBA)  Occupational History   Not on file  Tobacco Use   Smoking status: Former    Types: Cigars   Smokeless tobacco: Never   Tobacco comments:    1 cigar a month  Vaping Use   Vaping status: Never Used  Substance and Sexual Activity   Alcohol use: Not Currently    Alcohol/week: 1.0 standard drink of alcohol    Types: 1 Cans of beer per week   Drug use: No   Sexual activity: Yes  Other Topics Concern   Not on file  Social History Narrative    Married in 1974.   2 sons.   4 grandsons.   Active in church. Goes to Gym 3x per week.    Social Drivers of Health   Tobacco Use: Medium Risk (02/14/2024)   Patient History    Smoking Tobacco Use: Former    Smokeless Tobacco Use: Never    Passive Exposure: Not on file  Financial Resource Strain: Low Risk (11/26/2023)   Overall Financial Resource Strain (CARDIA)    Difficulty of Paying Living Expenses: Not very hard  Food Insecurity: No Food Insecurity (11/26/2023)   Epic    Worried About Programme Researcher, Broadcasting/film/video in the Last Year: Never true    Ran Out of Food in the Last Year: Never true  Transportation Needs: No Transportation Needs (11/26/2023)   Epic    Lack of Transportation (Medical): No    Lack of Transportation (Non-Medical): No  Physical Activity: Insufficiently Active (11/26/2023)   Exercise Vital Sign    Days of Exercise per Week: 3 days    Minutes of Exercise per Session: 40 min  Stress: No Stress Concern Present (05/18/2023)   [employer] of Occupational Health - Occupational Stress Questionnaire    Feeling of Stress : Not at all  Social Connections: Socially Integrated (11/26/2023)   Social Connection and Isolation Panel    Frequency of Communication with Friends and Family: More than three times a week    Frequency  of Social Gatherings with Friends and Family: Twice a week    Attends Religious Services: More than 4 times per year    Active Member of [employer] or Organizations: Yes    Attends Engineer, Structural: More than 4 times per year    Marital Status: Married  Catering Manager Violence: Not At Risk (05/18/2023)   Humiliation, Afraid, Rape, and Kick questionnaire    Fear of Current or Ex-Partner: No    Emotionally Abused: No    Physically Abused: No    Sexually Abused: No  Depression (PHQ2-9): Low Risk (05/18/2023)   Depression (PHQ2-9)    PHQ-2 Score: 0  Alcohol Screen: Low Risk (11/26/2023)   Alcohol Screen    Last Alcohol Screening Score (AUDIT):  1  Housing: Low Risk (11/26/2023)   Epic    Unable to Pay for Housing in the Last Year: No    Number of Times Moved in the Last Year: 0    Homeless in the Last Year: No  Utilities: Not At Risk (05/18/2023)   AHC Utilities    Threatened with loss of utilities: No  Health Literacy: Adequate Health Literacy (05/18/2023)   B1300 Health Literacy    Frequency of need for help with medical instructions: Never     History reviewed. No pertinent family history.   Review of Systems Pertinent items are noted in HPI.  Objective: Vital signs in last 24 hours:    02/14/2024   11:33 AM 11/29/2023    1:50 PM 10/25/2023    5:31 PM  Vitals with BMI  Height 6' 4 6' 0   Weight 210 lbs 194 lbs 10 oz   BMI 25.57 26.39   Systolic  140 161  Diastolic  74 95  Pulse  67 78      EXAM: General: Well nourished, well developed. Awake, alert and oriented to time, place, person. Normal mood and affect. No apparent distress. Breathing room air.  Operative Lower Extremity: Alignment - Neutral Deformity - None Skin intact Tenderness to palpation - none 5/5 TA, PT, GS, Per, EHL, FHL Sensation intact to light touch throughout Palpable DP and PT pulses Special testing: None  The contralateral foot/ankle was examined for comparison and noted to be neurovascularly intact with no localized deformity, swelling, or tenderness.  Imaging Review All images taken were independently reviewed by me.  Assessment/Plan: The clinical and radiographic findings were reviewed and discussed at length with the patient.  The patient presents for right ankle removal of deep hardware (syndesmosis fixation) and possible revision dynamic syndesmosis fixation.  We spoke at length about the natural course of these findings. We discussed nonoperative and operative treatment options in detail.  The risks and benefits were presented and reviewed. The risks due to hardware/suture failure and/or irritation (if removing  hardware: inability to remove part/all of hardware, recurrent instability), new/persistent infection, stiffness, nerve/vessel/tendon injury or rerupture of repaired tendon, nonunion/malunion, allograft usage, wound healing issues, development of arthritis, failure of this surgery, possibility of external fixation with delayed definitive surgery, need for further surgery, thromboembolic events, anesthesia/medical complications, amputation, death among others were discussed.  Jason Mullins  Orthopaedic Surgery [facility]     [1] (Not in an outpatient encounter) [2] No Known Allergies

## 2024-02-21 ENCOUNTER — Other Ambulatory Visit: Payer: Self-pay

## 2024-02-21 ENCOUNTER — Encounter (HOSPITAL_BASED_OUTPATIENT_CLINIC_OR_DEPARTMENT_OTHER)
Admission: RE | Disposition: A | Discharge status: Home / Self Care | Payer: Self-pay | Source: Home / Self Care | Attending: Orthopaedic Surgery

## 2024-02-21 ENCOUNTER — Encounter (HOSPITAL_BASED_OUTPATIENT_CLINIC_OR_DEPARTMENT_OTHER): Payer: Self-pay | Admitting: Orthopaedic Surgery

## 2024-02-21 ENCOUNTER — Ambulatory Visit (HOSPITAL_BASED_OUTPATIENT_CLINIC_OR_DEPARTMENT_OTHER)

## 2024-02-21 ENCOUNTER — Ambulatory Visit (HOSPITAL_BASED_OUTPATIENT_CLINIC_OR_DEPARTMENT_OTHER)
Admission: RE | Admit: 2024-02-21 | Discharge: 2024-02-21 | Disposition: A | Attending: Orthopaedic Surgery | Admitting: Orthopaedic Surgery

## 2024-02-21 ENCOUNTER — Encounter (HOSPITAL_BASED_OUTPATIENT_CLINIC_OR_DEPARTMENT_OTHER): Payer: Self-pay | Admitting: Anesthesiology

## 2024-02-21 DIAGNOSIS — I10 Essential (primary) hypertension: Secondary | ICD-10-CM | POA: Diagnosis not present

## 2024-02-21 DIAGNOSIS — G4730 Sleep apnea, unspecified: Secondary | ICD-10-CM

## 2024-02-21 DIAGNOSIS — Z87891 Personal history of nicotine dependence: Secondary | ICD-10-CM

## 2024-02-21 DIAGNOSIS — S82851A Displaced trimalleolar fracture of right lower leg, initial encounter for closed fracture: Secondary | ICD-10-CM | POA: Insufficient documentation

## 2024-02-21 DIAGNOSIS — X58XXXA Exposure to other specified factors, initial encounter: Secondary | ICD-10-CM | POA: Diagnosis not present

## 2024-02-21 DIAGNOSIS — Z79899 Other long term (current) drug therapy: Secondary | ICD-10-CM | POA: Diagnosis not present

## 2024-02-21 MED ORDER — LIDOCAINE 2% (20 MG/ML) 5 ML SYRINGE
INTRAMUSCULAR | Status: AC
  Filled 2024-02-21: qty 5

## 2024-02-21 MED ORDER — ASPIRIN 81 MG PO TBEC: 81.0000 mg | DELAYED_RELEASE_TABLET | Freq: Two times a day (BID) | ORAL | 0 refills | Status: AC

## 2024-02-21 MED ORDER — OXYCODONE HCL 5 MG PO TABS: 5.0000 mg | ORAL_TABLET | ORAL | 0 refills | Status: AC | PRN

## 2024-02-21 MED ORDER — PHENYLEPHRINE 80 MCG/ML (10ML) SYRINGE FOR IV PUSH (FOR BLOOD PRESSURE SUPPORT)
PREFILLED_SYRINGE | INTRAVENOUS | Status: DC | PRN
  Administered 2024-02-21 (×2): 160 ug via INTRAVENOUS

## 2024-02-21 MED ORDER — LACTATED RINGERS IV SOLN: INTRAVENOUS | Status: DC

## 2024-02-21 MED ORDER — DOCUSATE SODIUM 100 MG PO CAPS: 100.0000 mg | ORAL_CAPSULE | Freq: Two times a day (BID) | ORAL | 0 refills | Status: AC

## 2024-02-21 MED ORDER — ONDANSETRON HCL 4 MG/2ML IJ SOLN
INTRAMUSCULAR | Status: DC | PRN
  Administered 2024-02-21: 4 mg via INTRAVENOUS

## 2024-02-21 MED ORDER — FENTANYL CITRATE (PF) 100 MCG/2ML IJ SOLN
INTRAMUSCULAR | Status: AC
  Filled 2024-02-21: qty 2

## 2024-02-21 MED ORDER — CHLORHEXIDINE GLUCONATE 4 % EX SOLN: 60.0000 mL | Freq: Once | CUTANEOUS | Status: DC

## 2024-02-21 MED ORDER — LIDOCAINE 2% (20 MG/ML) 5 ML SYRINGE
INTRAMUSCULAR | Status: AC
  Filled 2024-02-21: qty 20

## 2024-02-21 MED ORDER — PROPOFOL 10 MG/ML IV BOLUS
INTRAVENOUS | Status: DC | PRN
  Administered 2024-02-21: 50 mg via INTRAVENOUS
  Administered 2024-02-21: 150 mg via INTRAVENOUS

## 2024-02-21 MED ORDER — DEXAMETHASONE SOD PHOSPHATE PF 10 MG/ML IJ SOLN
INTRAMUSCULAR | Status: DC | PRN
  Administered 2024-02-21: 5 mg via INTRAVENOUS

## 2024-02-21 MED ORDER — FENTANYL CITRATE (PF) 100 MCG/2ML IJ SOLN: 25.0000 ug | INTRAMUSCULAR | Status: DC | PRN

## 2024-02-21 MED ORDER — EPHEDRINE SULFATE (PRESSORS) 25 MG/5ML IV SOSY
PREFILLED_SYRINGE | INTRAVENOUS | Status: DC | PRN
  Administered 2024-02-21: 5 mg via INTRAVENOUS
  Administered 2024-02-21 (×2): 10 mg via INTRAVENOUS

## 2024-02-21 MED ORDER — PROPOFOL 10 MG/ML IV BOLUS
INTRAVENOUS | Status: AC
  Filled 2024-02-21: qty 20

## 2024-02-21 MED ORDER — MIDAZOLAM HCL (PF) 2 MG/2ML IJ SOLN
INTRAMUSCULAR | Status: DC | PRN
  Administered 2024-02-21: 2 mg via INTRAVENOUS

## 2024-02-21 MED ORDER — ONDANSETRON HCL 4 MG/2ML IJ SOLN
INTRAMUSCULAR | Status: AC
  Filled 2024-02-21: qty 2

## 2024-02-21 MED ORDER — MIDAZOLAM HCL 2 MG/2ML IJ SOLN
INTRAMUSCULAR | Status: AC
  Filled 2024-02-21: qty 2

## 2024-02-21 MED ORDER — LIDOCAINE 2% (20 MG/ML) 5 ML SYRINGE
INTRAMUSCULAR | Status: DC | PRN
  Administered 2024-02-21: 80 mg via INTRAVENOUS

## 2024-02-21 MED ORDER — PHENYLEPHRINE 80 MCG/ML (10ML) SYRINGE FOR IV PUSH (FOR BLOOD PRESSURE SUPPORT)
PREFILLED_SYRINGE | INTRAVENOUS | Status: AC
  Filled 2024-02-21: qty 20

## 2024-02-21 MED ORDER — CEFAZOLIN SODIUM-DEXTROSE 2-4 GM/100ML-% IV SOLN
2.0000 g | INTRAVENOUS | Status: AC
  Administered 2024-02-21: 2 g via INTRAVENOUS

## 2024-02-21 MED ORDER — DEXAMETHASONE SOD PHOSPHATE PF 10 MG/ML IJ SOLN
INTRAMUSCULAR | Status: AC
  Filled 2024-02-21: qty 1

## 2024-02-21 MED ORDER — CEFAZOLIN SODIUM-DEXTROSE 2-4 GM/100ML-% IV SOLN
INTRAVENOUS | Status: AC
  Filled 2024-02-21: qty 100

## 2024-02-21 MED ORDER — BUPIVACAINE-EPINEPHRINE 0.5% -1:200000 IJ SOLN
INTRAMUSCULAR | Status: DC | PRN
  Administered 2024-02-21: 30 mL

## 2024-02-21 MED ORDER — 0.9 % SODIUM CHLORIDE (POUR BTL) OPTIME
TOPICAL | Status: DC | PRN
  Administered 2024-02-21: 1000 mL

## 2024-02-21 MED ORDER — DROPERIDOL 2.5 MG/ML IJ SOLN: 0.6250 mg | Freq: Once | INTRAMUSCULAR | Status: DC | PRN

## 2024-02-21 MED ORDER — ONDANSETRON 4 MG PO TBDP: 4.0000 mg | ORAL_TABLET | Freq: Three times a day (TID) | ORAL | 0 refills | Status: AC | PRN

## 2024-02-21 MED ORDER — FENTANYL CITRATE (PF) 100 MCG/2ML IJ SOLN
INTRAMUSCULAR | Status: DC | PRN
  Administered 2024-02-21: 100 ug via INTRAVENOUS

## 2024-02-21 MED ORDER — ACETAMINOPHEN 500 MG PO TABS
ORAL_TABLET | ORAL | Status: AC
  Filled 2024-02-21: qty 2

## 2024-02-21 MED ORDER — ACETAMINOPHEN 500 MG PO TABS
1000.0000 mg | ORAL_TABLET | Freq: Once | ORAL | Status: AC
  Administered 2024-02-21: 1000 mg via ORAL

## 2024-02-21 MED ORDER — PHENYLEPHRINE HCL (PRESSORS) 10 MG/ML IV SOLN
INTRAVENOUS | Status: DC | PRN
  Administered 2024-02-21: 80 ug via INTRAVENOUS
  Administered 2024-02-21: 160 ug via INTRAVENOUS
  Administered 2024-02-21: 80 ug via INTRAVENOUS
  Administered 2024-02-21 (×2): 160 ug via INTRAVENOUS
  Administered 2024-02-21: 80 ug via INTRAVENOUS

## 2024-02-21 NOTE — Anesthesia Postprocedure Evaluation (Signed)
"   Anesthesia Post Note  Patient: Jason Mullins  Procedure(s) Performed: REMOVAL, HARDWARE (Right) REPAIR, SYNDESMOSIS, ANKLE (Right)     Patient location during evaluation: PACU Anesthesia Type: General Level of consciousness: sedated and patient cooperative Pain management: pain level controlled Vital Signs Assessment: post-procedure vital signs reviewed and stable Respiratory status: spontaneous breathing Cardiovascular status: stable Anesthetic complications: no   No notable events documented.  Last Vitals:  Vitals:   02/21/24 0830 02/21/24 1009  BP: (!) 175/101   Pulse: (!) 56 62  Resp: 18 15  Temp: 36.9 C   SpO2: 100% 100%    Last Pain:  Vitals:   02/21/24 0830  TempSrc: Temporal                 Norleen Pope      "

## 2024-02-21 NOTE — Op Note (Signed)
 02/21/2024  11:57 AM   PATIENT: Jason Mullins  74 y.o. male  MRN: [id number]   PRE-OPERATIVE DIAGNOSIS:   Right ankle symptomatic orthopaedic hardware   POST-OPERATIVE DIAGNOSIS:   Same   PROCEDURE: 1] Right ankle removal of deep hardware (syndesmosis fixation) 2] Right ankle syndesmosis ORIF   SURGEON:  Lillia Mountain, MD   ASSISTANT: None   ANESTHESIA: General, regional   EBL: Minimal   TOURNIQUET:    Total Tourniquet Time Documented: Thigh (Right) - 20 minutes Total: Thigh (Right) - 20 minutes    COMPLICATIONS: None apparent   DISPOSITION: Extubated, awake and stable to recovery.   INDICATION FOR PROCEDURE: The patient presented with above diagnosis.  We discussed the diagnosis, alternative treatment options, risks and benefits of the above surgical intervention, as well as alternative non-operative treatments. All questions/concerns were addressed and the patient/family demonstrated appropriate understanding of the diagnosis, the procedure, the postoperative course, and overall prognosis. The patient wished to proceed with surgical intervention and signed an informed surgical consent as such, in each others presence prior to surgery.   PROCEDURE IN DETAIL: After preoperative consent was obtained and the correct operative site was identified, the patient was brought to the operating room supine on stretcher. General anesthesia was induced. Preoperative antibiotics were administered. Surgical timeout was taken. The patient was then positioned supine. The operative lower extremity was prepped and draped in standard sterile fashion with a tourniquet around the thigh. The extremity was exsanguinated and the tourniquet was inflated to 275 mmHg.  Prior lateralapproach was utilized over the distal fibula and dissection carried down to the level of plate. The syndesmosis fixation was removed completely.   Next, a dynamic suture fixation system (ZipTight device)  was implanted through the fibula plate in cannulated fashion to fix the syndesmosis. Anchor/button position was verified along anteromedial tibial cortex by fluoroscopy. A repeat stress radiograph showed complete stability of the ankle mortise to testing. This was repeated for a second Ziptight device.   The surgical sites were thoroughly irrigated. The tourniquet was deflated and hemostasis achieved. The skin was closed without tension.    The leg was cleaned with saline and sterile mepitel dressings with gauze were applied. A well padded sterile wrap was applied. The patient was awakened from anesthesia and transported to the recovery room in stable condition.    FOLLOW UP PLAN: -transfer to PACU, then home -strict NWB operative extremity until nerve block wears off, then OK to WBAT, maximum elevation -maintain dressings until follow up -DVT ppx: Aspirin  81 mg twice daily -follow up as outpatient within 7-10 days for wound check -sutures out in 2-3 weeks in outpatient office   RADIOGRAPHS: AP, lateral, oblique and stress radiographs of the operative ankle were obtained intraoperatively. These showed interval removal of the syndesmosis screws. Manual stress radiographs were taken and the joints were noted to be stable following hardware removal and revision fixation. No other acute injuries are noted.  Lillia Mountain Orthopaedic Surgery [facility]

## 2024-02-21 NOTE — Transfer of Care (Signed)
 Immediate Anesthesia Transfer of Care Note  Patient: Jason Mullins  Procedure(s) Performed: REMOVAL, HARDWARE (Right) REPAIR, SYNDESMOSIS, ANKLE (Right)  Patient Location: PACU  Anesthesia Type:General  Level of Consciousness: awake, drowsy, and patient cooperative  Airway & Oxygen Therapy: Patient Spontanous Breathing and Patient connected to face mask oxygen  Post-op Assessment: Report given to RN and Post -op Vital signs reviewed and stable  Post vital signs: Reviewed and stable  Last Vitals:  Vitals Value Taken Time  BP    Temp    Pulse 62 02/21/24 10:09  Resp 15 02/21/24 10:09  SpO2 100 % 02/21/24 10:09    Last Pain:  Vitals:   02/21/24 0830  TempSrc: Temporal      Patients Stated Pain Goal: 4 (02/21/24 0830)  Complications: No notable events documented.

## 2024-02-21 NOTE — Anesthesia Procedure Notes (Signed)
 Procedure Name: LMA Insertion Date/Time: 02/21/2024 9:29 AM  Performed by: Burnard Rosaline HERO, CRNAPre-anesthesia Checklist: Patient identified, Emergency Drugs available, Suction available and Patient being monitored Patient Re-evaluated:Patient Re-evaluated prior to induction Oxygen Delivery Method: Circle system utilized Preoxygenation: Pre-oxygenation with 100% oxygen Induction Type: IV induction Ventilation: Mask ventilation without difficulty LMA: LMA inserted LMA Size: 5.0 Number of attempts: 1 Airway Equipment and Method: Bite block Placement Confirmation: positive ETCO2, breath sounds checked- equal and bilateral and CO2 detector Tube secured with: Tape Dental Injury: Teeth and Oropharynx as per pre-operative assessment

## 2024-02-21 NOTE — Anesthesia Preprocedure Evaluation (Addendum)
"                                    Anesthesia Evaluation  Patient identified by MRN, date of birth, ID band Patient awake    Reviewed: Allergy & Precautions, NPO status , Patient's Chart, lab work & pertinent test results  Airway Mallampati: II  TM Distance: >3 FB Neck ROM: Limited    Dental  (+) Dental Advisory Given, Teeth Intact   Pulmonary sleep apnea , former smoker   Pulmonary exam normal breath sounds clear to auscultation       Cardiovascular hypertension, Pt. on medications Normal cardiovascular exam Rhythm:Irregular Rate:Normal     Neuro/Psych negative neurological ROS  negative psych ROS   GI/Hepatic negative GI ROS,,,(+) Hepatitis -, C  Endo/Other  negative endocrine ROS    Renal/GU negative Renal ROS     Musculoskeletal  (+) Arthritis ,    Abdominal   Peds  Hematology negative hematology ROS (+)   Anesthesia Other Findings   Reproductive/Obstetrics                              Anesthesia Physical Anesthesia Plan  ASA: 3  Anesthesia Plan: General   Post-op Pain Management: Tylenol  PO (pre-op)*   Induction: Intravenous  PONV Risk Score and Plan: 2 and Ondansetron , Dexamethasone  and Treatment may vary due to age or medical condition  Airway Management Planned: LMA  Additional Equipment: None  Intra-op Plan:   Post-operative Plan: Extubation in OR  Informed Consent: I have reviewed the patients History and Physical, chart, labs and discussed the procedure including the risks, benefits and alternatives for the proposed anesthesia with the patient or authorized representative who has indicated his/her understanding and acceptance.     Dental advisory given  Plan Discussed with: CRNA  Anesthesia Plan Comments:          Anesthesia Quick Evaluation  "

## 2024-02-21 NOTE — H&P (Signed)
 H&P Update:  -History and Physical Reviewed  -Patient has been re-examined  -No change in the plan of care  -The risks and benefits were presented and reviewed. The risks due to hardware/suture failure and/or irritation (if removing hardware: inability to remove part/all of hardware, recurrent instability), new/persistent infection, stiffness, nerve/vessel/tendon injury or rerupture of repaired tendon, nonunion/malunion, allograft usage, wound healing issues, development of arthritis, failure of this surgery, possibility of external fixation with delayed definitive surgery, need for further surgery, thromboembolic events, anesthesia/medical complications, amputation, death among others were discussed. The patient acknowledged the explanation, agreed to proceed with the plan and a consent was signed.  Jason Mullins

## 2024-02-22 ENCOUNTER — Encounter (HOSPITAL_BASED_OUTPATIENT_CLINIC_OR_DEPARTMENT_OTHER): Payer: Self-pay | Admitting: Orthopaedic Surgery

## 2024-05-21 ENCOUNTER — Ambulatory Visit
